# Patient Record
Sex: Female | Born: 1955 | Race: Black or African American | Hispanic: No | Smoking: Current every day smoker
Health system: Southern US, Community
[De-identification: ages and names within clinical notes are randomized; demographics above are authoritative.]

## PROBLEM LIST (undated history)

## (undated) DIAGNOSIS — I10 Essential (primary) hypertension: Secondary | ICD-10-CM

## (undated) DIAGNOSIS — E7800 Pure hypercholesterolemia, unspecified: Secondary | ICD-10-CM

## (undated) HISTORY — PX: BLADDER SURGERY: SHX569

## (undated) HISTORY — PX: CHOLECYSTECTOMY: SHX55

## (undated) HISTORY — PX: SHOULDER SURGERY: SHX246

## (undated) HISTORY — PX: MANDIBLE FRACTURE SURGERY: SHX706

---

## 2001-09-15 ENCOUNTER — Emergency Department (HOSPITAL_COMMUNITY): Admission: EM | Admit: 2001-09-15 | Discharge: 2001-09-15 | Payer: Self-pay | Admitting: Emergency Medicine

## 2001-09-15 ENCOUNTER — Emergency Department (HOSPITAL_COMMUNITY): Admission: EM | Admit: 2001-09-15 | Discharge: 2001-09-15 | Payer: Self-pay

## 2001-09-16 ENCOUNTER — Inpatient Hospital Stay (HOSPITAL_COMMUNITY): Admission: AD | Admit: 2001-09-16 | Discharge: 2001-09-20 | Payer: Self-pay | Admitting: Oral Surgery

## 2001-09-16 ENCOUNTER — Encounter: Payer: Self-pay | Admitting: Oral Surgery

## 2001-09-16 ENCOUNTER — Encounter (INDEPENDENT_AMBULATORY_CARE_PROVIDER_SITE_OTHER): Payer: Self-pay | Admitting: Specialist

## 2001-09-19 ENCOUNTER — Encounter: Payer: Self-pay | Admitting: Oral Surgery

## 2002-01-20 ENCOUNTER — Encounter: Admission: RE | Admit: 2002-01-20 | Discharge: 2002-01-20 | Payer: Self-pay | Admitting: Internal Medicine

## 2002-01-20 ENCOUNTER — Encounter: Payer: Self-pay | Admitting: Internal Medicine

## 2002-05-27 ENCOUNTER — Encounter: Payer: Self-pay | Admitting: Oral Surgery

## 2002-05-27 ENCOUNTER — Inpatient Hospital Stay (HOSPITAL_COMMUNITY): Admission: RE | Admit: 2002-05-27 | Discharge: 2002-05-31 | Payer: Self-pay | Admitting: Oral Surgery

## 2003-02-10 ENCOUNTER — Emergency Department (HOSPITAL_COMMUNITY): Admission: EM | Admit: 2003-02-10 | Discharge: 2003-02-10 | Payer: Self-pay | Admitting: Emergency Medicine

## 2003-02-26 ENCOUNTER — Encounter: Admission: RE | Admit: 2003-02-26 | Discharge: 2003-02-26 | Payer: Self-pay | Admitting: Family Medicine

## 2003-02-26 ENCOUNTER — Encounter: Payer: Self-pay | Admitting: Family Medicine

## 2003-04-15 ENCOUNTER — Emergency Department (HOSPITAL_COMMUNITY): Admission: EM | Admit: 2003-04-15 | Discharge: 2003-04-15 | Payer: Self-pay | Admitting: Emergency Medicine

## 2003-04-18 ENCOUNTER — Emergency Department (HOSPITAL_COMMUNITY): Admission: EM | Admit: 2003-04-18 | Discharge: 2003-04-18 | Payer: Self-pay | Admitting: Emergency Medicine

## 2004-06-10 ENCOUNTER — Ambulatory Visit: Payer: Self-pay | Admitting: Family Medicine

## 2004-06-10 ENCOUNTER — Ambulatory Visit: Payer: Self-pay | Admitting: *Deleted

## 2004-07-04 ENCOUNTER — Ambulatory Visit: Payer: Self-pay | Admitting: Family Medicine

## 2004-07-05 ENCOUNTER — Emergency Department (HOSPITAL_COMMUNITY): Admission: EM | Admit: 2004-07-05 | Discharge: 2004-07-05 | Payer: Self-pay | Admitting: Emergency Medicine

## 2004-07-06 ENCOUNTER — Emergency Department (HOSPITAL_COMMUNITY): Admission: EM | Admit: 2004-07-06 | Discharge: 2004-07-06 | Payer: Self-pay | Admitting: Emergency Medicine

## 2004-10-06 ENCOUNTER — Ambulatory Visit: Payer: Self-pay | Admitting: Family Medicine

## 2005-02-03 ENCOUNTER — Ambulatory Visit: Payer: Self-pay | Admitting: Family Medicine

## 2005-03-13 ENCOUNTER — Ambulatory Visit: Payer: Self-pay | Admitting: Family Medicine

## 2005-05-16 ENCOUNTER — Ambulatory Visit: Payer: Self-pay | Admitting: Family Medicine

## 2005-07-05 ENCOUNTER — Ambulatory Visit: Payer: Self-pay | Admitting: Family Medicine

## 2005-08-23 ENCOUNTER — Ambulatory Visit: Payer: Self-pay | Admitting: Family Medicine

## 2005-09-05 ENCOUNTER — Ambulatory Visit: Payer: Self-pay | Admitting: Family Medicine

## 2006-01-10 ENCOUNTER — Ambulatory Visit: Payer: Self-pay | Admitting: Family Medicine

## 2006-02-08 ENCOUNTER — Ambulatory Visit: Payer: Self-pay | Admitting: Family Medicine

## 2006-05-07 ENCOUNTER — Ambulatory Visit: Payer: Self-pay | Admitting: Family Medicine

## 2006-09-06 ENCOUNTER — Ambulatory Visit: Payer: Self-pay | Admitting: Family Medicine

## 2006-10-09 ENCOUNTER — Ambulatory Visit (HOSPITAL_COMMUNITY): Admission: RE | Admit: 2006-10-09 | Discharge: 2006-10-09 | Payer: Self-pay | Admitting: Obstetrics and Gynecology

## 2006-11-05 ENCOUNTER — Ambulatory Visit: Payer: Self-pay | Admitting: Family Medicine

## 2007-01-02 ENCOUNTER — Ambulatory Visit: Payer: Self-pay | Admitting: Family Medicine

## 2007-04-01 DIAGNOSIS — I10 Essential (primary) hypertension: Secondary | ICD-10-CM

## 2007-04-01 DIAGNOSIS — E785 Hyperlipidemia, unspecified: Secondary | ICD-10-CM | POA: Insufficient documentation

## 2007-04-13 ENCOUNTER — Emergency Department (HOSPITAL_COMMUNITY): Admission: EM | Admit: 2007-04-13 | Discharge: 2007-04-14 | Payer: Self-pay | Admitting: Emergency Medicine

## 2007-05-23 ENCOUNTER — Ambulatory Visit (HOSPITAL_COMMUNITY): Admission: RE | Admit: 2007-05-23 | Discharge: 2007-05-23 | Payer: Self-pay | Admitting: Dentistry

## 2007-07-10 ENCOUNTER — Ambulatory Visit: Payer: Self-pay | Admitting: Internal Medicine

## 2007-07-10 ENCOUNTER — Encounter (INDEPENDENT_AMBULATORY_CARE_PROVIDER_SITE_OTHER): Payer: Self-pay | Admitting: Family Medicine

## 2007-07-10 LAB — CONVERTED CEMR LAB
AST: 15 units/L (ref 0–37)
Albumin: 4.2 g/dL (ref 3.5–5.2)
Alkaline Phosphatase: 57 units/L (ref 39–117)
CO2: 25 meq/L (ref 19–32)
Cholesterol: 187 mg/dL (ref 0–200)
HDL: 41 mg/dL (ref >39)
Potassium: 3.7 meq/L (ref 3.5–5.3)
Sodium: 140 meq/L (ref 135–145)

## 2007-07-29 ENCOUNTER — Ambulatory Visit (HOSPITAL_COMMUNITY): Admission: RE | Admit: 2007-07-29 | Discharge: 2007-07-30 | Payer: Self-pay | Admitting: Surgery

## 2007-07-29 ENCOUNTER — Encounter (INDEPENDENT_AMBULATORY_CARE_PROVIDER_SITE_OTHER): Payer: Self-pay | Admitting: Surgery

## 2007-11-08 ENCOUNTER — Emergency Department (HOSPITAL_COMMUNITY): Admission: EM | Admit: 2007-11-08 | Discharge: 2007-11-08 | Payer: Self-pay | Admitting: Emergency Medicine

## 2007-11-10 ENCOUNTER — Emergency Department (HOSPITAL_COMMUNITY): Admission: EM | Admit: 2007-11-10 | Discharge: 2007-11-10 | Payer: Self-pay | Admitting: Emergency Medicine

## 2007-11-20 ENCOUNTER — Ambulatory Visit (HOSPITAL_COMMUNITY): Admission: RE | Admit: 2007-11-20 | Discharge: 2007-11-20 | Payer: Self-pay | Admitting: Family Medicine

## 2008-02-19 ENCOUNTER — Ambulatory Visit: Payer: Self-pay | Admitting: Internal Medicine

## 2008-02-20 ENCOUNTER — Emergency Department (HOSPITAL_COMMUNITY): Admission: EM | Admit: 2008-02-20 | Discharge: 2008-02-20 | Payer: Self-pay | Admitting: Emergency Medicine

## 2008-03-25 ENCOUNTER — Encounter (INDEPENDENT_AMBULATORY_CARE_PROVIDER_SITE_OTHER): Payer: Self-pay | Admitting: Family Medicine

## 2008-03-25 ENCOUNTER — Ambulatory Visit: Payer: Self-pay | Admitting: Internal Medicine

## 2008-03-25 LAB — CONVERTED CEMR LAB
ALT: 17 units/L (ref 0–35)
AST: 14 units/L (ref 0–37)
Albumin: 4.4 g/dL (ref 3.5–5.2)
Alkaline Phosphatase: 63 units/L (ref 39–117)
Glucose, Bld: 109 mg/dL — ABNORMAL HIGH (ref 70–99)
HDL: 50 mg/dL (ref >39)
Total Bilirubin: 0.3 mg/dL (ref 0.3–1.2)
Total CHOL/HDL Ratio: 3.7
Total Protein: 7.2 g/dL (ref 6.0–8.3)
Triglycerides: 204 mg/dL — ABNORMAL HIGH (ref <150)

## 2008-03-27 ENCOUNTER — Emergency Department (HOSPITAL_COMMUNITY): Admission: EM | Admit: 2008-03-27 | Discharge: 2008-03-27 | Payer: Self-pay | Admitting: Emergency Medicine

## 2008-06-17 ENCOUNTER — Emergency Department (HOSPITAL_COMMUNITY): Admission: EM | Admit: 2008-06-17 | Discharge: 2008-06-17 | Payer: Self-pay | Admitting: Emergency Medicine

## 2008-07-19 ENCOUNTER — Emergency Department (HOSPITAL_COMMUNITY): Admission: EM | Admit: 2008-07-19 | Discharge: 2008-07-19 | Payer: Self-pay | Admitting: Emergency Medicine

## 2008-08-17 ENCOUNTER — Ambulatory Visit: Payer: Self-pay | Admitting: Internal Medicine

## 2008-09-06 ENCOUNTER — Emergency Department (HOSPITAL_COMMUNITY): Admission: EM | Admit: 2008-09-06 | Discharge: 2008-09-06 | Payer: Self-pay | Admitting: Emergency Medicine

## 2008-10-03 ENCOUNTER — Emergency Department (HOSPITAL_COMMUNITY): Admission: EM | Admit: 2008-10-03 | Discharge: 2008-10-03 | Payer: Self-pay | Admitting: Family Medicine

## 2008-11-21 ENCOUNTER — Inpatient Hospital Stay (HOSPITAL_COMMUNITY): Admission: AD | Admit: 2008-11-21 | Discharge: 2008-11-21 | Payer: Self-pay | Admitting: Obstetrics & Gynecology

## 2008-12-08 ENCOUNTER — Ambulatory Visit (HOSPITAL_COMMUNITY): Admission: RE | Admit: 2008-12-08 | Discharge: 2008-12-08 | Payer: Self-pay | Admitting: Family Medicine

## 2009-06-19 ENCOUNTER — Emergency Department (HOSPITAL_COMMUNITY): Admission: EM | Admit: 2009-06-19 | Discharge: 2009-06-19 | Payer: Self-pay | Admitting: Emergency Medicine

## 2009-09-06 ENCOUNTER — Encounter: Admission: RE | Admit: 2009-09-06 | Discharge: 2009-09-06 | Payer: Self-pay | Admitting: Orthopedic Surgery

## 2010-06-10 ENCOUNTER — Emergency Department (HOSPITAL_COMMUNITY): Admission: EM | Admit: 2010-06-10 | Discharge: 2010-06-10 | Payer: Self-pay | Admitting: Emergency Medicine

## 2010-07-03 ENCOUNTER — Emergency Department (HOSPITAL_COMMUNITY): Admission: EM | Admit: 2010-07-03 | Discharge: 2010-07-03 | Payer: Self-pay | Admitting: Emergency Medicine

## 2010-08-11 ENCOUNTER — Emergency Department (HOSPITAL_COMMUNITY): Admission: EM | Admit: 2010-08-11 | Discharge: 2010-08-11 | Payer: Self-pay | Admitting: Emergency Medicine

## 2010-11-25 ENCOUNTER — Emergency Department (HOSPITAL_COMMUNITY)
Admission: EM | Admit: 2010-11-25 | Discharge: 2010-11-25 | Disposition: A | Discharge status: Home / Self Care | Payer: Medicare HMO | Attending: Emergency Medicine | Admitting: Emergency Medicine

## 2010-11-25 DIAGNOSIS — R220 Localized swelling, mass and lump, head: Secondary | ICD-10-CM | POA: Insufficient documentation

## 2010-11-25 DIAGNOSIS — Z8659 Personal history of other mental and behavioral disorders: Secondary | ICD-10-CM | POA: Insufficient documentation

## 2010-11-25 DIAGNOSIS — H9210 Otorrhea, unspecified ear: Secondary | ICD-10-CM | POA: Insufficient documentation

## 2010-11-25 DIAGNOSIS — I10 Essential (primary) hypertension: Secondary | ICD-10-CM | POA: Insufficient documentation

## 2010-11-25 DIAGNOSIS — H9209 Otalgia, unspecified ear: Secondary | ICD-10-CM | POA: Insufficient documentation

## 2010-11-25 DIAGNOSIS — Z79899 Other long term (current) drug therapy: Secondary | ICD-10-CM | POA: Insufficient documentation

## 2010-11-25 DIAGNOSIS — R221 Localized swelling, mass and lump, neck: Secondary | ICD-10-CM | POA: Insufficient documentation

## 2010-11-25 DIAGNOSIS — E789 Disorder of lipoprotein metabolism, unspecified: Secondary | ICD-10-CM | POA: Insufficient documentation

## 2010-11-25 DIAGNOSIS — H60399 Other infective otitis externa, unspecified ear: Secondary | ICD-10-CM | POA: Insufficient documentation

## 2010-11-25 DIAGNOSIS — Z9889 Other specified postprocedural states: Secondary | ICD-10-CM | POA: Insufficient documentation

## 2010-12-06 ENCOUNTER — Other Ambulatory Visit (HOSPITAL_COMMUNITY): Payer: Self-pay | Admitting: Internal Medicine

## 2010-12-06 DIAGNOSIS — Z1231 Encounter for screening mammogram for malignant neoplasm of breast: Secondary | ICD-10-CM

## 2010-12-13 LAB — DIFFERENTIAL
Eosinophils Absolute: 0.1 10*3/uL (ref 0.0–0.7)
Eosinophils Relative: 1 % (ref 0–5)
Lymphs Abs: 3.2 10*3/uL (ref 0.7–4.0)
Monocytes Absolute: 0.9 10*3/uL (ref 0.1–1.0)
Monocytes Relative: 8 % (ref 3–12)

## 2010-12-13 LAB — COMPREHENSIVE METABOLIC PANEL
ALT: 19 U/L (ref 0–35)
Alkaline Phosphatase: 84 U/L (ref 39–117)
BUN: 5 mg/dL — ABNORMAL LOW (ref 6–23)
GFR calc Af Amer: >60 mL/min (ref >60)
GFR calc non Af Amer: >60 mL/min (ref >60)
Glucose, Bld: 128 mg/dL — ABNORMAL HIGH (ref 70–99)
Potassium: 3.1 mEq/L — ABNORMAL LOW (ref 3.5–5.1)

## 2010-12-13 LAB — LIPASE, BLOOD: Lipase: 25 U/L (ref 11–59)

## 2010-12-13 LAB — CBC
MCHC: 33.9 g/dL (ref 30.0–36.0)
MCV: 78.6 fL (ref 78.0–100.0)
Platelets: 262 10*3/uL (ref 150–400)
RDW: 17.7 % — ABNORMAL HIGH (ref 11.5–15.5)
WBC: 11.2 10*3/uL — ABNORMAL HIGH (ref 4.0–10.5)

## 2011-01-06 ENCOUNTER — Ambulatory Visit (HOSPITAL_COMMUNITY)
Admission: RE | Admit: 2011-01-06 | Discharge: 2011-01-06 | Disposition: A | Discharge status: Home / Self Care | Payer: Medicare Other | Source: Ambulatory Visit | Attending: Internal Medicine | Admitting: Internal Medicine

## 2011-01-06 DIAGNOSIS — Z1231 Encounter for screening mammogram for malignant neoplasm of breast: Secondary | ICD-10-CM | POA: Insufficient documentation

## 2011-01-17 LAB — CBC
Hemoglobin: 10.6 g/dL — ABNORMAL LOW (ref 12.0–15.0)
RBC: 5.18 MIL/uL — ABNORMAL HIGH (ref 3.87–5.11)
RDW: 21.4 % — ABNORMAL HIGH (ref 11.5–15.5)
WBC: 9.3 10*3/uL (ref 4.0–10.5)

## 2011-01-17 LAB — URINALYSIS, ROUTINE W REFLEX MICROSCOPIC
Nitrite: NEGATIVE
Protein, ur: NEGATIVE mg/dL
Urobilinogen, UA: 0.2 mg/dL (ref 0.0–1.0)

## 2011-02-14 NOTE — Op Note (Signed)
NAMEMARGRETT, KALB           ACCOUNT NO.:  [id number]   MEDICAL RECORD NO.:  [id number]          PATIENT TYPE:  OIB   LOCATION:  5731                         FACILITY:  [facility]   PHYSICIAN:  Sandria Bales. Ezzard Standing, M.D.  DATE OF BIRTH:  Feb 16, 1956   DATE OF PROCEDURE:  07/29/2007  DATE OF DISCHARGE:                               OPERATIVE REPORT   PREOPERATIVE DIAGNOSIS:  Chronic cholecystitis, cholelithiasis.   POSTOPERATIVE DIAGNOSIS:  Chronic cholecystitis, cholelithiasis, intra-  abdominal adhesions secondary to prior abdominal surgery.   PROCEDURE:  Laparoscopic cholecystectomy with intraoperative  cholangiogram and lysis of internal abdominal adhesions (15 units).   SURGEON:  Sandria Bales. Ezzard Standing, M.D.   FIRST ASSISTANT:  Lennie Muckle, MD   ANESTHESIA:  General tracheal.   ESTIMATED BLOOD LOSS:  Minimal.   INDICATIONS FOR PROCEDURE:  Ms. Cullinan is a 55 year old black female  patient of Dr. Margarette Canada at [facility] who has presented to the [facility] Emergency Room over the summer with right upper quadrant pain.  An  ultrasound has shown multiple gallstones.  She did have a history of an  open partial gastrectomy for ulcer disease in 1978,and an appendectomy  through a lower abdominal incision.   I discussed with her the indications, potential complications of  gallbladder surgery.  Potential complications include but not limited to  bleeding, infection, bile duct injury and open surgery.  Because her  prior history of upper abdominal surgery, she was at increased risk for  open surgery and potential complications of gallbladder surgery.   OPERATIVE NOTE:  The patient taken to the operating room and underwent  general endotracheal anesthetic.  A time-out was held identifying the  patient and procedure.  She was given one gram of Ancef at initiation of  procedure.   An infraumbilical incision was made with sharp dissection and carried  down to the abdominal cavity. On  entering abdominal cavity patient was  noted to have adhesions in the right upper quadrant where I was trying  to get to the gallbladder.  I placed two 5 mm trocars below these  adhesions.  I tried to place them in a form in which I could also do the  gallbladder surgery.  I spent about 15 minutes taking down these  anterior abdominal wall adhesions.  However, when I got to the right  upper quadrant on the gallbladder and liver this area actually freed up  pretty well.  She also had adhesions on her midline and adhesions  towards the pelvis but I left these alone.   The right lobe of the liver that I could see was otherwise unremarkable.  The gallbladder itself though was stuck up, the duodenal was stuck up to  the gallbladder whether this is due to chronic disease or due to prior  abdominal surgery is hard to know.   The gallbladder was grasped, rotated cephalad.  Dissection carried out  on the cystic duct gallbladder junction.  I placed a clip on the  gallbladder side of the cystic duct.  I then shot intraoperative  cholangiogram.   The intraoperative cholangiogram was shot using cutoff  taut catheter  inserted through a 14 gauge Jelco to the receiver catheter and inserted  through the side of the cut cystic duct and secured with an Endo clip.   After the taut catheter was inserted.  I then shot half-strength Hypaque  solution.  I used about 8 to 9 mL actually high strength Renografin  solution.  This was looked at under fluoroscopy and showed flow down the  cystic duct, down the common bile duct into the duodenum out the hepatic  radicals was felt to be normal intraoperative cholangiogram.   The taut catheter was then removed, the cystic duct was then triply  endoclipped and divided.  I then found anterior and posterior branch of  cystic artery which I doubly endoclipped, divided.  It appeared that her  cystic artery dove back in the liver so this may have been some kind of   aberrant right hepatic artery but I thought I preserved this anatomy  during the surgery.   I then took the gallbladder both grossly and bluntly off the gallbladder  bed.  I completely divided the gallbladder.  I then revisualized the  gallbladder bed and the triangle of Calot.  I saw no bleeding or bile  leak.  I then delivered the gallbladder in the EndoCatch bag and  delivered it through the umbilicus.  The abdomen was then irrigated with  about 1.5 L saline.  The gallbladder bed again was reevaluated. I did  look at her pelvis which she had a uterus that had multiple fibroids.  These all appeared grossly benign.  I left this alone.  The 5 mm trocars  were then removed, the 10 mm trocar was a zero Vicryl suture was tied  there.  I then reinspect the abdominal wall which looked good.  The skin  at each site was closed with 5-0 Vicryl suture painted with tincture of  benzoin and steri-stripped.   The patient tolerated procedure well, was transported to recovery room  in good condition.  Sponge and needle counts correct end the case.      Sandria Bales. Ezzard Standing, M.D.  Electronically Signed     DHN/MEDQ  D:  07/29/2007  T:  07/30/2007  Job:  [id number]   cc:   Kelly Splinter, Kathie Rhodes [address]

## 2011-02-17 NOTE — Discharge Summary (Signed)
[place]. [facility]  Patient:    Sharon [name], Sharon [name] Visit Number: [id number] MRN: [id number]          Service Type: MED Location: [phone] Attending Physician:  Georgia Lopes Dictated by:   Georgia Lopes, D.M.D. Admit Date:  09/16/2001 Discharge Date: 09/20/2001                             Discharge Summary  ADMISSION DIAGNOSES:  Tumor, right mandible.  DISCHARGE DIAGNOSIS:  Tumor, right mandible.  HOSPITAL COURSE:  The patient was admitted on September 16, 2001, for removal of tumor of right mandible.  She was taken to the operating room and the tumor was removed in the previously dictated operative note.  There was some infection at the time of tumor removal.  The patient was admitted postoperatively and placed on intravenous pain medicine, intravenous antibiotics.  She had extraoral Jackson-Pratt drain which continued to drain mild to moderate amounts and the drain was removed on December 20.  She remained afebrile and stable postoperatively.  At the time of her discharge, she was able to tolerate p.o. fluids and soft diet and had mild postoperative edema.  DISCHARGE MEDICATIONS: 1. Percocet 10/325 60 tablets one q.4-6h. p.r.n. pain. 2. Augmentin 875 28 tablets one b.i.d. for two weeks. 3. Peridex one bottle swish and spit t.i.d. 4. Nifedinpine ER 30 mg 90 tablets one p.o. q.d. 5. Zyprexa 15 mg one p.o. b.i.d. 60 tablets two refills. 6. Zantac 150 mg 30 tablets one q.d. p.o. one refill.  ACTIVITY:  The patient was told to take it easy with no lifting or bending over.  She was told to remain on soft foods.  To add Ensure and Sustical to her diet.  She was instructed to use warm salt water rinses after each meal and to brush teeth after meals.  She was told to apply ice to her face for three days and then use moist heat.  She was given a follow-up appointment on Monday, December 23, for follow-up.  CONDITION ON DISCHARGE:  Much  improved.  Tumor was removed.  Mandible was stabilized with a large reconstructive bone plate as verified by postoperative x-ray. Dictated by:   Georgia Lopes, D.M.D. Attending Physician:  Georgia Lopes DD:  09/20/01 TD:  09/22/01 Job: [id number]

## 2011-02-17 NOTE — Discharge Summary (Signed)
NAME:  Sharon [name], Sharon [name]                     ACCOUNT NO.:  [id number]   MEDICAL RECORD NO.:  [id number]                   PATIENT TYPE:  INP   LOCATION:  5733                                 FACILITY:  [facility]   PHYSICIAN:  Georgia Lopes, M.D.               DATE OF BIRTH:  December 30, 1955   DATE OF ADMISSION:  05/27/2002  DATE OF DISCHARGE:                                 DISCHARGE SUMMARY   ADMITTING DIAGNOSIS:  Continuity defect right mandible.   DISCHARGE DIAGNOSIS:  Continuity defect right mandible, status post repair  with autogenous bone graft.   HOSPITAL COURSE:  The patient was admitted for surgery to reconstruct a  defect that occurred after removal of an aggressive bone tumor in December  2002.  She underwent surgery on May 27, 2002.  At that time the  reconstruction bone plate was removed and a cadaver mandible graft was  adapted and placed in the jaw and autogenous bone graft was harvested by Dr.  Marcene Corning concurrently.  The surgery went very well.  The patient's  jaws were wired shut after surgery.  She received intravenous antibiotics,  intravenous steroids, intravenous pain medicine via PCA pump Morphine.  She  had a stable intraoperative course.  A postoperative radiograph demonstrated  near anatomic alignment of the bone graft.  She was weaned off of  intravenous fluids and was able to increase her P.O. intake to an adequate  level.  She was ambulating well after her surgery and was discharged on  May 31, 2002.   CONDITION AT DISCHARGE:  Good.  Reconstruction of mandible satisfactory.   DISCHARGE MEDICATIONS:  Augmentin Suspension 10 day supply 500 mg q 6 hours  times 10 days, Medrol Dose Pack one pack, the patient was instructed to  crush the pills and take them as directed, and Demerol Syrup 50 mg per 5 cc  8 ounces, take one teaspoon q 4-6 hours p.r.n. pain.  She was instructed to  use Demerol for moderate to severe pain and to use liquid Tylenol for  mild  pain.   ACTIVITY:  No heavy lifting and generally taking it easy.   DIET:  Pureed diet as the patient's jaws were wired shut.   SPECIAL INSTRUCTIONS:  Ice to be applied to the right face on and off, 30  minutes on, 30 minutes off while the patient was awake.    FOLLOW UP:  Follow up visit scheduled for Wednesday, June 04, 2002,  patient to call [phone] for time of appointment.                                               Georgia Lopes, M.D.    SMJ/MEDQ  D:  05/31/2002  T:  06/02/2002  Job:  [id number]   cc:  Lubertha Basque Jerl Santos, M.D.  [address]  [place]  [place] [address]  Fax: [phone]

## 2011-02-17 NOTE — H&P (Signed)
. [facility]  Patient:    Sharon [name], Sharon [name] Visit Number: [id number] MRN: [id number]          Service Type: Attending:  Georgia Lopes, D.M.D. Dictated by:   Georgia Lopes, D.M.D. Adm. Date:  09/16/01                           History and Physical  CHIEF COMPLAINT:  Sharon [name] is a 55 year old black female who was seen in my office on September 04, 2001, with the chief complaint of swelling and pain of the right jaw.  She was referred by her general dentist, Dr. Deshawn Friar, for evaluation of possible tumor of the right mandible.  HISTORY OF PRESENT ILLNESS:  [place] notes that she has had some swelling of this area for the past three to six months.  A panoramic radiograph was taken in the office which demonstrated a multilocular radiolucency approximately 3.0 cm x 6.0 cm distal to tooth #30, extending up the ascending ramus of the right mandible.  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  An incisional biopsy was taken in the office on September 09, 2001, and the pathology result was consistent with odontogenic keratocyst.  This specimen was submitted to [facility].  The patient developed swelling and fever postoperatively after this biopsy done under local anesthesia, and presented to [facility] on September 15, 2001, complaining of inability to swallow, and swelling.  She was examined there and had some right pharyngeal and tonsillar wall edema, but had no exudate at that time, and minimal trismus.  She could open to 25.0 mm.  She was sent to the office the next day, after being placed on Augmentin 875 mg and Vicodin.  On September 16, 2001, on exam in the office she did have some exudate from the right pharyngeal and tonsillar wall areas.  This area was irrigated, and the patient was admitted for intravenous antibiotics and scheduled for a surgical excision of the tumor.  ALLERGIES:   No known drug allergies.  PAST MEDICAL HISTORY 1. Hypertension. 2. Paranoid schizophrenia. 3. History of arthritis. 4. Stomach ulcers. 5. Venereal disease.  CURRENT MEDICATIONS 1. Zyprexa 15 mg b.i.d. 2. Nifedipine ER 30 mg q.d. 3. Ranitidine 150 mg q.d.  PAST SURGICAL HISTORY:  Ulcer operation in 1978.  SOCIAL HISTORY:  One pack a day for six years of tobacco, and one can of beer per day.  PHYSICAL EXAMINATION  GENERAL:  A well-developed, well-nourished 55 year old black female, in no obvious distress.  VITAL SIGNS:  Blood pressure 160/90, pulse 112, respirations 16.  Height 5 feet 3 inches, weight 200 pounds.  SKIN:  Without rashes or lesions.  HEENT:  Head normocephalic, atraumatic.  Eyes:  Pupils equal, round, reactive to light and accommodation.  Ears:  Tympanic membranes intact.  Nose:  Septum midline.  Oral:  Swelling in the right mandibular ascending ramus, with swelling of right pharyngeal and tonsillar walls, slight uvular shift to the left.  The patient opens to approximately 25 mm.  Dentition appears healthy. Lateral swelling of the right mandible, nontender.  NECK:  Supple, no jugular venous distention.  HEART:  A regular rate and rhythm without murmur.  LUNGS:  Clear.  ABDOMEN:  Soft, positive bowel sounds, nontender.  BREASTS/GU/RECTAL:  Deferred.  EXTREMITIES:  Without clubbing, cyanosis, or edema.  NEUROLOGIC:  Intact.  Cranial nerves intact.  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  PLAN:  Admission with intravenous antibiotics and resection of the lesion on September 18, 2001. Dictated by:   Georgia Lopes, D.M.D. Attending:  Georgia Lopes, D.M.D. DD:  09/17/01 TD:  09/17/01 Job: [id number]

## 2011-02-17 NOTE — H&P (Signed)
NAME:  Sharon [name], Sharon [name]                     ACCOUNT NO.:  [id number]   MEDICAL RECORD NO.:  [id number]                   PATIENT TYPE:  INP   LOCATION:  2550                                 FACILITY:  [facility]   PHYSICIAN:  Georgia Lopes, M.D.               DATE OF BIRTH:  Aug 26, 1956   DATE OF ADMISSION:  05/27/2002  DATE OF DISCHARGE:                                HISTORY & PHYSICAL   HISTORY OF PRESENT ILLNESS:  The patient is a 55 year old black female who  presents now for reconstruction of the right mandible, status post  mandibular resection on September 16, 2001.  She complains of inability to  eat and chew properly and paresis of the right lower lip.   ALLERGIES:  She has no known drug allergies.   PAST MEDICAL HISTORY:  Hypertension, paranoid schizophrenia, history of  arthritis, stomach ulcers, venereal disease.   CURRENT MEDICATIONS:  1. Zyprexa 50 mg b.i.d.  2. Nifedipine ER 30 mg q.d.  3. Amantadine 150 mg q.d.   HISTORY OF PRESENT ILLNESS:  The patient has undergone jaw resection,  September 16, 2001, for removal of aggressive odontogenic keratocyst that was  also secondarily infected, a large reconstruction, and bone plate was placed  at the time of her initial surgery.  The condyle was spared and the right  mandible was spared to the second premolar.  The nerve was sacrificed during  the surgery.   PAST SURGICAL HISTORY:  Ulcer operation in 1978; jaw resection, September 16, 2001.   SOCIAL HISTORY:  Smokes one pack a day of tobacco, one can of beer per day.   PHYSICAL EXAMINATION:  GENERAL:  Moderately obese, 55 year old black female  in no acute distress.  VITAL SIGNS:  Heart rate 88, respirations 18, BP 153/97, 98.9.  Weight is  210.  SKIN:  Without rashes or lesions.  HEENT:  Head normocephalic, atraumatic.  Pupils are equal, round and  reactive to light and accommodation.  Ears:  Tympanic membranes intact.  Nose:  Septum midline.  Oropharynx.   Clear.  Missing teeth of the right  mandible secondary to jaw resection.  NECK:  Supple.  No JVD.  HEART:  Regular rate and rhythm.  No murmurs.  LUNGS:  Clear.  ABDOMEN:  Soft.  Positive bowel sounds.  Nontender.  BREASTS:  Deferred.  GU:  Deferred.  RECTAL:  Deferred.  EXTREMITIES:  Without cyanosis, clubbing or edema.  NEUROLOGIC:  Neurologically intact with exception of the mental branch of  the inferior alveolar nerve; there is numbness over the right lower lip.    IMPRESSION:  Status post resection of mandible.   PLAN:  Reconstruct with cadaver mandible and iliac crest autogenous bone.  Georgia Lopes, M.D.    SMJ/MEDQ  D:  05/27/2002  T:  05/28/2002  Job:  [id number]

## 2011-02-17 NOTE — Op Note (Signed)
[place]. [facility]  Patient:    EMMI, WERTHEIM Visit Number: [id number] MRN: [id number]          Service Type: MED Location: [phone] Attending Physician:  Georgia Lopes Dictated by:   Georgia Lopes, D.M.D. Proc. Date: 09/18/01 Admit Date:  09/16/2001                             Operative Report  PREOPERATIVE DIAGNOSIS:  Tumor of right mandible, odontogenic keratocyst.  POSTOPERATIVE DIAGNOSIS:  Tumor of right mandible, odontogenic keratocyst.  PROCEDURE:  Resection of right mandible, preserving the right mandibular condyle, and nerve repair of right lingual nerve.  SURGEON:  Georgia Lopes, D.M.D.  ANESTHESIA:  General nasal.  DESCRIPTION OF PROCEDURE:  The patient was taken to the operating room and placed on the table in the supine position.  General anesthesia was administered intravenously and a nasal endotracheal tube was placed and secured.  The eyes were lubricated and protected.  A Foley catheter was placed and the table was turned.  The patient was prepped and draped.  A throat pack was placed.  Then 2% lidocaine, 1:100,000 epinephrine was infiltrated as an inferior alveolar block and an infiltration on the right mandible.  A total of six carpules was used.  Palpation was performed over the spontaneously draining abscess area of the right tonsillar area of a former biopsy of the right mandible, and this was also irrigated and suctioned.  There was some serosanguineous exudate, but no frank pus.  Then attention was turned to the right extraoral area.  The marking pen was used to demarcate the area of the inferior border and the ascending ramus of the mandible, the area of the tumor, and the first molar on the right side. Then, an incision was demarcated at 1 cm below the inferior border of the mandible, and this was made approximately 6 cm long in length.  Then lidocaine 2%, 1:100,000 epinephrine was infiltrated along the  incision line.  A total of four carpules was utilized.  After allowing time for hemostasis and vasoconstriction, a #15 blade was used to make a full thickness incision through skin and subcutaneous tissue.  Dissection was carried down to the platysmal layer.  A nerve stimulator was used to test for the facial nerve, and the marginal mandibular _________ nerve was then identified in the dissection.  The patient was not paralyzed for the surgery.  Dissection was carried down through the platysma and then down through the deeper structures. The facial artery and vein were ligated with 2-0 silk sutures.  The inferior border of the mandible was identified and cut with sharp dissection.  Then the mandible was stripped buccally anterior up towards the mental foramen and beyond towards the midline and distally up to the area of the condyle and the coronoid notch.  There was no obvious perforation of the odontogenic tumor through the buccal plate, but upon lingual dissection, there was some evidence of perforation, and the dissection was stopped at this point, and attention was then turned intraorally.  A #15 blade was then used to make a full thickness incision, approximately a 0.5 cm from the neck of tooth #30 and the free gingival margin.  This was carried down to the bone circumferentially around this tooth on the lingual end, buccal side, and then this dissection was carried lingually over the ascending ramus and around the biopsy site, and  then leaving approximately 0.5 cm margin around the area of the tumor.  Periosteal dissection was used with the periosteal elevator to connect to the intraoral incision.  After this dissection was completed, attention was then turned back to the extraoral dissection.  A large OsteoMed 2.4 mm bone plate was then adapted to the external mandible and secured there with 2.4 screws.  Then the plate was removed and using the reciprocating saw, a vertical cut  was made distal to tooth #30, and then a cut was made in the area of the sigmoid notch similar to a vertical ramus osteotomy, sparing the condyle.  Then the mandible was mobilized with a Kocher bone clamp and dissection was further carried out on the lingual, attempting to dissect up the tissues towards the periosteum, taking care to preserve a margin of tissue around the tumor.  Then, the mandible was freed and removed from the mouth.  It was noted at this time that the lingual nerve was inadvertently pulled from its junction at the tongue, and a 7-0 Vicryl was used to reposition the nerve.  This was done using unmagnified visualization. The nerve ends were both cut in a straight line with scissors and sutured with four 7-0 Vicryl sutures in a primary nerve repair.  Then bleeding vessels left behind were cauterized and ligated with 3-0 silk.  The bone plate was reapproximated and rescrewed with the previously placed bone screws.  Then the loose tissue was closed with 3-0 Vicryl, closing from extraorally, the mucosal layer first, and then closing dead space.  A Jackson-Pratt 7 mm drain was placed, and then the tissues were closed in layers until the subcutaneous layers were closed with 3-0 chromic and the skin was closed with 5-0 nylon.  During placement of the bone plate, in order to drill screw holes, percutaneous incisions were made at the preauricular area with a 15 blade using blunt dissection, and the percutaneous trocar and cannula.  These incisions were then closed at the end of the case with one 5-0 nylon.  Then the Jackson-Pratt drain was secured to the skin with a 3-0 silk, and then the oral cavity was inspected.  There was one area that had not been completely closed with 3-0 Vicryl and this was closed after irrigating with bug juice with 3-0 chromic.  Then the oral cavity was totally suctioned.  The throat pack was removed.  The occlusion was assessed and found to be stable,  and good opening and closing.  Then Bacitracin was placed on the wound.  Fluffs and a 4 inch Ace bandage were placed for a pressure dressing.   The patient was awakened and taken to the recovery room, breathing spontaneously in good condition.  Estimated blood loss 300 cc.  Complications none.  Specimen was right mandible resected bone and lymph node from right mandible submandibular area. Dictated by:   Georgia Lopes, D.M.D. Attending Physician:  Georgia Lopes DD:  09/18/01 TD:  09/19/01 Job: [id number]

## 2011-02-17 NOTE — Op Note (Signed)
NAMEMarland Kitchen  [place], [place]                     ACCOUNT NO.:  [id number]   MEDICAL RECORD NO.:  [id number]                   PATIENT TYPE:  INP   LOCATION:  2899                                 FACILITY:  [facility]   PHYSICIAN:  Lubertha Basque. Jerl Santos, M.D.             DATE OF BIRTH:  [birth year]   DATE OF PROCEDURE:  05/27/2002  DATE OF DISCHARGE:                                 OPERATIVE REPORT   PREOPERATIVE DIAGNOSES:  Right mandibular continuity defect.   POSTOPERATIVE DIAGNOSES:  Right mandibular continuity defect.   OPERATION PERFORMED:  Left iliac crest bone graft harvest.   SURGEON:  Lubertha Basque. Jerl Santos, M.D.   ASSISTANT:  Prince Rome, P.A.   ANESTHESIA:  General.   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  She has been left with a  continuity defect.  She is to undergo allograft reconstruction of the  mandible and orthopedics was consulted for iliac crest bone graft harvest.  The procedure was discussed with the patient and informed operative consent  was obtained after discussion of possible complications of reaction to  anesthesia, fracture,  and pain about the iliac crest bone graft harvest  site.   DESCRIPTION OF PROCEDURE:  The patient was taken to the operating suite  where general anesthetic was applied without difficulty.  The patient was  positioned supine and prepped and draped in the normal sterile fashion.  After administration of preop intravenous antibiotics, the left iliac crest  was approached through a small incision.  Dissection was carried down  through an abundance of adipose tissue to the fascia over the anterior  aspect of the iliac crest.  Care was taken not to go medial to the ASIS.  Osteotomes were used to open a window on the superior aspect of the crest.  Curets were then used to harvest bone from between the two walls of the  ileum.  Several ounces of bone  were harvested with the curets.  The wound  was then irrigated by placement of half a sheet of Gelfoam into the iliac  crest area.  0 Vicryl was used to reapproximate the fascia over the bone  graft harvest site followed by 2-0 undyed  Vicryl suture in the adipose and  subcutaneous tissues.  Skin was then closed followed by injection of  Marcaine about the incision site and application of Adaptic and dry gauze  dressing with tape.  The oral surgery procedure was proceeding concomitantly  and continued after closure of the hip wound.  Estimated blood loss and  intraoperative fluids can be obtained from anesthesia records and from the  oral surgery notes.   DISPOSITION:  The patient was to continue with her oral surgery procedure.  Lubertha Basque Jerl Santos, M.D.   PGD/MEDQ  D:  05/27/2002  T:  05/29/2002  Job:  [id number]

## 2011-02-17 NOTE — Op Note (Signed)
NAME:  Sharon [name], Sharon [name]                     ACCOUNT NO.:  [id number]   MEDICAL RECORD NO.:  [id number]                   PATIENT TYPE:  INP   LOCATION:  5733                                 FACILITY:  [facility]   PHYSICIAN:  Georgia Lopes, M.D.               DATE OF BIRTH:  1956/09/09   DATE OF PROCEDURE:  05/27/2002  DATE OF DISCHARGE:                                 OPERATIVE REPORT   PREOPERATIVE DIAGNOSIS:  Right mandibular continuity defect.   PREOPERATIVE DIAGNOSIS:  Right mandibular continuity defect.   PROCEDURE:  Freeze dried hemimandible to restore continuity to right jaw,  iliac crest marrow to right mandible and intermaxillary fixation.   SURGEON:  Georgia Lopes, M.D.   ANESTHESIA:  General nasal.   DESCRIPTION OF PROCEDURE:  The patient was taken to the operating room and  placed on the table in the supine position.  General anesthesia was  administered and a nasal endotracheal tube was placed.  The eyes were  lubricated and protected and a nasogastric tube was placed. A Foley was also  placed.  Dr. Hurshel Keys began micronic EPA and proceeded to sterilely  drape the left iliac crest for graft harvest during that time.  The arch  bars were placed in the oral cavity.  This was done non-sterile. A throat  pack was placed and 2% lidocaine with 1:100,000 epinephrine was infiltrated  into a right and left inferior alveolar block and infiltration around the  maxilla, a total of 4 carpules were used. Then the [facility] arch bars were  adapted to the upper arch and ligated to the teeth with 24 gauge wires for  the molars, premolars and canines and 26 gauge wires for the anterior teeth.  Another arch bar was ligated to the lower jaw in the same fashion. Then the  throat pack was removed and elastic traction was used to place the teeth  into intermaxillary fixation.  At the termination of the hip graft, the  drapes were removed, a dressing had been applied to the hip and  the neck and  head were prepped and draped.  A 10/10 unit was used to drape off the mouth.  Then a sterile marking pen was used to demarcate the elliptical incision to  excise the previous scar from prior mandibular resection surgery.  Then 2%  lidocaine 1:100,000 with epinephrine was infiltrated for a total of 10 cc.  Then a #15 blade was used to make a long elliptical incision approximately  10 cm long and the previous scar was removed. The skin margins were then  undermined and dissection was carried down through the tissue layers taking  care to adjust for the facial nerve with a nerve stimulator.  The platysma  was not identified.  The dissection was carried down to the mandibular bone  plate.  Bleeding vessels were cauterized with electrocautery and ligated  with 2-0 silk ties.  Then the  dissection was over the existing distal  portion of the mandible on the right side and this was freed up to expose  the bone screws and dissection was also carried proximally along the area of  the condyle.  Then using a universal screwdriver all screws were removed and  the plate was removed.  The bony margins were then freshened and a cadaver  mandible was obtained and was trimmed to fit in this defect which was  approximately 7 cm long.  Bur holes were made in the lingual plate of the  freeze dried mandible and the buccal plate was removed. The condyle and  coronoid process were also removed.  After the mandibular graft was properly  trimmed, it was fixed to the distal segment using a four hole KLS plate, 2.3  mm.  Holes were drilled in the bone with a drill under irrigation and 5 and  7 mm screws were placed at the condyle. Another four hole plate was adapted  and screw holes were drilled and screws were placed.  Then the previously  harvested iliac graft was mixed with freeze dried bone to provide adequate  volume of grafting material and then this was placed in the hemimandible and  was packed  up to the condyle process and to the remaining stump of the  distal process of the mandible.  Then the area was closed with  3-0 Vicryl, 4-0 chromic and a running subcuticular 5-0 Monocryl. Bacitracin  was applied and 4 x 4s, Kerlix and an ace bandage were applied.  The patient  was awakened and taken to the recovery room in good condition. Estimated  blood loss was 150 cc.  Complications were none.  Specimens were none.                                                Georgia Lopes, M.D.    SMJ/MEDQ  D:  05/27/2002  T:  05/29/2002  Job:  [id number]

## 2011-07-03 LAB — URINALYSIS, ROUTINE W REFLEX MICROSCOPIC
Glucose, UA: NEGATIVE
Nitrite: NEGATIVE
Urobilinogen, UA: 0.2

## 2011-07-12 LAB — DIFFERENTIAL
Basophils Absolute: 0
Basophils Relative: 0
Eosinophils Absolute: 0.1
Lymphocytes Relative: 39
Lymphs Abs: 3.9 — ABNORMAL HIGH
Monocytes Absolute: 0.7
Monocytes Relative: 7

## 2011-07-12 LAB — COMPREHENSIVE METABOLIC PANEL
AST: 23
Albumin: 3.8
Alkaline Phosphatase: 63
BUN: 6
Calcium: 9.5
Chloride: 101
GFR calc Af Amer: >60
GFR calc non Af Amer: >60
Total Protein: 7

## 2011-07-12 LAB — CBC
MCHC: 32.9
RBC: 4.66
RDW: 15 — ABNORMAL HIGH
WBC: 10.1

## 2011-07-18 LAB — DIFFERENTIAL
Basophils Absolute: 0.1
Lymphocytes Relative: 17
Monocytes Absolute: 1 — ABNORMAL HIGH
Neutrophils Relative %: 75

## 2011-07-18 LAB — COMPREHENSIVE METABOLIC PANEL
AST: 27
Albumin: 3.5
Alkaline Phosphatase: 67
BUN: 12
Chloride: 101
Creatinine, Ser: 0.9
Sodium: 137
Total Bilirubin: 0.5
Total Protein: 7.3

## 2011-07-18 LAB — CBC
HCT: 40.1
Hemoglobin: 13.2
WBC: 15.8 — ABNORMAL HIGH

## 2011-07-18 LAB — LIPASE, BLOOD: Lipase: 16

## 2011-12-04 ENCOUNTER — Other Ambulatory Visit (HOSPITAL_COMMUNITY): Payer: Self-pay | Admitting: Family Medicine

## 2011-12-04 DIAGNOSIS — Z1231 Encounter for screening mammogram for malignant neoplasm of breast: Secondary | ICD-10-CM

## 2012-01-08 ENCOUNTER — Ambulatory Visit (HOSPITAL_COMMUNITY)
Admission: RE | Admit: 2012-01-08 | Discharge: 2012-01-08 | Disposition: A | Discharge status: Home / Self Care | Payer: Medicare HMO | Source: Ambulatory Visit | Attending: Family Medicine | Admitting: Family Medicine

## 2012-01-08 DIAGNOSIS — Z1231 Encounter for screening mammogram for malignant neoplasm of breast: Secondary | ICD-10-CM | POA: Insufficient documentation

## 2012-10-19 ENCOUNTER — Emergency Department (INDEPENDENT_AMBULATORY_CARE_PROVIDER_SITE_OTHER)
Admission: EM | Admit: 2012-10-19 | Discharge: 2012-10-19 | Disposition: A | Discharge status: Home / Self Care | Payer: Medicare Other | Source: Home / Self Care | Attending: Family Medicine | Admitting: Family Medicine

## 2012-10-19 ENCOUNTER — Encounter (HOSPITAL_COMMUNITY): Payer: Self-pay | Admitting: Emergency Medicine

## 2012-10-19 DIAGNOSIS — H00019 Hordeolum externum unspecified eye, unspecified eyelid: Secondary | ICD-10-CM

## 2012-10-19 HISTORY — DX: Essential (primary) hypertension: I10

## 2012-10-19 MED ORDER — MOXIFLOXACIN HCL 0.5 % OP SOLN: 1.0000 [drp] | Freq: Three times a day (TID) | OPHTHALMIC | Status: DC

## 2012-10-19 MED ORDER — CEPHALEXIN 500 MG PO CAPS: 500.0000 mg | ORAL_CAPSULE | Freq: Four times a day (QID) | ORAL | Status: DC

## 2012-10-19 NOTE — ED Provider Notes (Signed)
History     CSN: [id number]  Arrival date & time 10/19/12  1133   First MD Initiated Contact with Patient 10/19/12 1138      Chief Complaint  Patient presents with  . Stye    left lower eye lid swollen and red    (Consider location/radiation/quality/duration/timing/severity/associated sxs/prior treatment) Patient is a 57 y.o. female presenting with eye problem. The history is provided by the patient.  Eye Problem  This is a new problem. The current episode started more than 2 days ago. The problem occurs constantly. The problem has been gradually worsening. The left eye is affected.There was no injury mechanism. The pain is mild. There is no history of trauma to the eye. There is no known exposure to pink eye. She does not wear contacts. Pertinent negatives include no numbness, no blurred vision, no decreased vision, no discharge, no photophobia and no eye redness. Associated symptoms comments: H/o similar problems as child, none in many yrs.. She has tried water and eye drops for the symptoms.    Past Medical History  Diagnosis Date  . Hypertension     Past Surgical History  Procedure Date  . Cholecystectomy   . Bladder surgery   . Shoulder surgery   . Mandible fracture surgery     History reviewed. No pertinent family history.  History  Substance Use Topics  . Smoking status: Never Smoker   . Smokeless tobacco: Not on file  . Alcohol Use: No    OB History    Grav Para Term Preterm Abortions TAB SAB Ect Mult Living                  Review of Systems  Constitutional: Negative.   HENT: Negative.   Eyes: Positive for pain. Negative for blurred vision, photophobia, discharge, redness and itching.  Neurological: Negative for numbness.    Allergies  Review of patient's allergies indicates no known allergies.  Home Medications   Current Outpatient Rx  Name  Route  Sig  Dispense  Refill  . HYDRALAZINE-HCTZ PO   Oral   Take by mouth.         Marland Kitchen NIFEDIPINE  ER OSMOTIC 90 MG PO TB24   Oral   Take 90 mg by mouth daily.         Marland Kitchen OLANZAPINE 20 MG PO TABS   Oral   Take 20 mg by mouth at bedtime.         Marland Kitchen PRAVASTATIN SODIUM PO   Oral   Take by mouth.         . CEPHALEXIN 500 MG PO CAPS   Oral   Take 1 capsule (500 mg total) by mouth 4 (four) times daily. Take all of medicine and drink lots of fluids   20 capsule   0   . MOXIFLOXACIN HCL 0.5 % OP SOLN   Left Eye   Place 1 drop into the left eye 3 (three) times daily. After warm cloth soak to eyelid   3 mL   0     BP 168/98  Pulse 105  Temp 99 F (37.2 C) (Oral)  Resp 22  SpO2 96%  Physical Exam  Nursing note and vitals reviewed. Constitutional: She is oriented to person, place, and time. She appears well-developed and well-nourished.  HENT:  Head: Normocephalic.  Right Ear: External ear normal.  Left Ear: External ear normal.  Mouth/Throat: Oropharynx is clear and moist.  Eyes: Conjunctivae normal and EOM are normal. Pupils are  equal, round, and reactive to light.    Neck: Normal range of motion. Neck supple.  Neurological: She is alert and oriented to person, place, and time.  Skin: Skin is warm and dry.    ED Course  Procedures (including critical care time)  Labs Reviewed - No data to display No results found.   1. Stye       MDM          Linna Hoff, MD 10/19/12 1344

## 2012-10-19 NOTE — ED Notes (Signed)
Waiting discharge papers 

## 2012-10-19 NOTE — ED Notes (Addendum)
Pt c/o swelling of left lower eye lid. Eye is red and swollen with some soreness. Symptoms started on Tuesday morning. Pt states that she has used warm compresses and eye ointment with no relief of symptoms.

## 2012-12-07 ENCOUNTER — Encounter (HOSPITAL_COMMUNITY): Payer: Self-pay | Admitting: Emergency Medicine

## 2012-12-07 ENCOUNTER — Emergency Department (INDEPENDENT_AMBULATORY_CARE_PROVIDER_SITE_OTHER)
Admission: EM | Admit: 2012-12-07 | Discharge: 2012-12-07 | Disposition: A | Discharge status: Home / Self Care | Payer: Medicare Other | Source: Home / Self Care

## 2012-12-07 DIAGNOSIS — H00019 Hordeolum externum unspecified eye, unspecified eyelid: Secondary | ICD-10-CM

## 2012-12-07 DIAGNOSIS — H00016 Hordeolum externum left eye, unspecified eyelid: Secondary | ICD-10-CM

## 2012-12-07 HISTORY — DX: Pure hypercholesterolemia, unspecified: E78.00

## 2012-12-07 MED ORDER — MOXIFLOXACIN HCL 0.5 % OP SOLN: 1.0000 [drp] | Freq: Three times a day (TID) | OPHTHALMIC | Status: DC

## 2012-12-07 MED ORDER — CEPHALEXIN 500 MG PO CAPS: 500.0000 mg | ORAL_CAPSULE | Freq: Four times a day (QID) | ORAL | Status: DC

## 2012-12-07 NOTE — ED Notes (Signed)
Stye on left eye, swollen left eye lid.  Onset Thursday am

## 2012-12-07 NOTE — ED Provider Notes (Signed)
History     CSN: [id number]  Arrival date & time 12/07/12  1112   First MD Initiated Contact with Patient 12/07/12 1207      Chief Complaint  Patient presents with  . Stye    HPI: Patient is a 57 y.o. female presenting with eye problem. The history is provided by the patient.  Eye Problem Location:  L eye Quality:  Stinging and aching Onset quality:  Sudden Timing:  Constant Progression:  Worsening Chronicity:  Recurrent Relieved by:  Nothing Worsened by:  Nothing tried Ineffective treatments:  None tried Associated symptoms: inflammation, redness and swelling   Associated symptoms: no blurred vision, no crusting, no decreased vision, no discharge and no itching   Pt reports onset of a stye to the upper lid of her (L) eye Thursday morning. Since onset has grown in size and has become much more TTP. No pain in her eye unless she blinks. Denies visual disturbances or trauma to the eye. Denies fever or recent URI sx's. Admits to h/o getting these rater frequently in the past. States she was here in January and was given a PO abx and eye drops that really helped. Would like refills of these meds.   Past Medical History  Diagnosis Date  . Hypertension   . High cholesterol     Past Surgical History  Procedure Laterality Date  . Cholecystectomy    . Bladder surgery    . Shoulder surgery    . Mandible fracture surgery      No family history on file.  History  Substance Use Topics  . Smoking status: Never Smoker   . Smokeless tobacco: Not on file  . Alcohol Use: No    OB History   Grav Para Term Preterm Abortions TAB SAB Ect Mult Living                  Review of Systems  Eyes: Positive for redness. Negative for blurred vision, discharge and itching.  All other systems reviewed and are negative.    Allergies  Review of patient's allergies indicates no known allergies.  Home Medications   Current Outpatient Rx  Name  Route  Sig  Dispense  Refill  .  hydrochlorothiazide (HYDRODIURIL) 25 MG tablet   Oral   Take 25 mg by mouth daily.         . cephALEXin (KEFLEX) 500 MG capsule   Oral   Take 1 capsule (500 mg total) by mouth 4 (four) times daily. Take all of medicine and drink lots of fluids   20 capsule   0   . HYDRALAZINE-HCTZ PO   Oral   Take by mouth.         . moxifloxacin (VIGAMOX) 0.5 % ophthalmic solution   Left Eye   Place 1 drop into the left eye 3 (three) times daily. After warm cloth soak to eyelid   3 mL   0   . NIFEdipine (PROCARDIA XL/ADALAT-CC) 90 MG 24 hr tablet   Oral   Take 90 mg by mouth daily.         Marland Kitchen OLANZapine (ZYPREXA) 20 MG tablet   Oral   Take 20 mg by mouth at bedtime.         Marland Kitchen PRAVASTATIN SODIUM PO   Oral   Take by mouth.           BP 129/68  Pulse 103  Temp(Src) 98.5 F (36.9 C) (Oral)  Resp 20  SpO2 94%  Physical Exam  Nursing note and vitals reviewed. Constitutional: She appears well-developed and well-nourished.  HENT:  Head: Normocephalic and atraumatic.  Eyes: Conjunctivae are normal. Right eye exhibits no chemosis, no discharge, no exudate and no hordeolum. No foreign body present in the right eye. Left eye exhibits hordeolum. Left eye exhibits no chemosis, no discharge and no exudate. Right conjunctiva is not injected. Right conjunctiva has no hemorrhage. Left conjunctiva is not injected. Left conjunctiva has no hemorrhage. No scleral icterus.    Neck: Neck supple.  Cardiovascular: Normal rate.   Pulmonary/Chest: Effort normal.  Musculoskeletal: Normal range of motion.  Neurological: She is alert.  Skin: Skin is warm and dry.  Psychiatric: She has a normal mood and affect.    ED Course  Procedures (including critical care time)  Labs Reviewed - No data to display No results found.   No diagnosis found.    MDM  Stye to upper eyelid of (L) eye. Sclera and conjunctiva otherwise normal. Will treat w/ Cephalexin and Vigamox eye drops. Will provide  opthalmology referral for f/u if needed.         Leanne Chang, NP 12/07/12 [phone]

## 2012-12-08 NOTE — ED Provider Notes (Signed)
  I have directly reviewed the clinical findings, lab, imaging studies and management of this patient in detail. I  agree with the documentation,  as recorded by the Physician extender.  SINGH,PRASHANT K M.D on 12/08/2012 at 12:56 PM  [facility] Office  [phone] 

## 2012-12-10 ENCOUNTER — Other Ambulatory Visit (HOSPITAL_COMMUNITY): Payer: Self-pay | Admitting: Family Medicine

## 2012-12-10 DIAGNOSIS — Z1231 Encounter for screening mammogram for malignant neoplasm of breast: Secondary | ICD-10-CM

## 2013-01-08 ENCOUNTER — Ambulatory Visit (HOSPITAL_COMMUNITY)
Admission: RE | Admit: 2013-01-08 | Discharge: 2013-01-08 | Disposition: A | Discharge status: Home / Self Care | Payer: Medicare HMO | Source: Ambulatory Visit | Attending: Family Medicine | Admitting: Family Medicine

## 2013-01-08 DIAGNOSIS — Z1231 Encounter for screening mammogram for malignant neoplasm of breast: Secondary | ICD-10-CM | POA: Insufficient documentation

## 2013-01-13 ENCOUNTER — Encounter (HOSPITAL_COMMUNITY): Payer: Self-pay | Admitting: Emergency Medicine

## 2013-01-13 ENCOUNTER — Emergency Department (INDEPENDENT_AMBULATORY_CARE_PROVIDER_SITE_OTHER): Payer: Medicare HMO

## 2013-01-13 ENCOUNTER — Emergency Department (INDEPENDENT_AMBULATORY_CARE_PROVIDER_SITE_OTHER)
Admission: EM | Admit: 2013-01-13 | Discharge: 2013-01-13 | Disposition: A | Discharge status: Home / Self Care | Payer: Medicare HMO | Source: Home / Self Care | Attending: Emergency Medicine | Admitting: Emergency Medicine

## 2013-01-13 DIAGNOSIS — J309 Allergic rhinitis, unspecified: Secondary | ICD-10-CM

## 2013-01-13 DIAGNOSIS — J40 Bronchitis, not specified as acute or chronic: Secondary | ICD-10-CM

## 2013-01-13 DIAGNOSIS — J302 Other seasonal allergic rhinitis: Secondary | ICD-10-CM

## 2013-01-13 MED ORDER — FEXOFENADINE-PSEUDOEPHED ER 60-120 MG PO TB12: 1.0000 | ORAL_TABLET | Freq: Two times a day (BID) | ORAL | Status: AC

## 2013-01-13 MED ORDER — FLUTICASONE PROPIONATE 50 MCG/ACT NA SUSP: 2.0000 | Freq: Every day | NASAL | Status: DC

## 2013-01-13 MED ORDER — AMOXICILLIN 500 MG PO CAPS: 500.0000 mg | ORAL_CAPSULE | Freq: Three times a day (TID) | ORAL | Status: AC

## 2013-01-13 NOTE — ED Notes (Signed)
Pt c/o cold sx onset 4/2 Sx include: productive cough, sneezing, itchy eyes, chest/nasal congestion Denies: f/v/n/d No hx of seasonal allergies Taking Nyquil and benadryl w/little relief.  She is alert and oriented w/no signs of acute distress.

## 2013-01-13 NOTE — ED Provider Notes (Signed)
History     CSN: [id number]  Arrival date & time 01/13/13  1045   First MD Initiated Contact with Patient 01/13/13 1053      Chief Complaint  Patient presents with  . URI    (Consider location/radiation/quality/duration/timing/severity/associated sxs/prior treatment) HPI Comments: Patient presents this morning complaining of ongoing and worsening cough feeling her chest is getting congested. Occasionally she brings up sputum or phlegm and loops yellowish in character. She has also been sneezing with watery and itchy eyes runny nose and some itchiness and discomfort in the back of her throat. At this point the most of bothersome symptom is her ongoing cough. She denies any fevers or shortness of breath she is a smoker. Have been using NyQuil and Benadryl and aspirin for her current symptoms.  Patient is a 57 y.o. female presenting with URI. The history is provided by the patient.  URI Presenting symptoms: congestion, cough, rhinorrhea and sore throat   Presenting symptoms: no ear pain and no fever   Severity:  Moderate Onset quality:  Gradual Duration:  3 weeks Timing:  Constant Progression:  Worsening Chronicity:  New Relieved by:  Nothing Worsened by:  Nothing tried Associated symptoms: sneezing   Associated symptoms: no arthralgias, no headaches, no neck pain, no sinus pain, no swollen glands and no wheezing   Risk factors: no recent travel     Past Medical History  Diagnosis Date  . Hypertension   . High cholesterol     Past Surgical History  Procedure Laterality Date  . Cholecystectomy    . Bladder surgery    . Shoulder surgery    . Mandible fracture surgery      No family history on file.  History  Substance Use Topics  . Smoking status: Never Smoker   . Smokeless tobacco: Not on file  . Alcohol Use: No    OB History   Grav Para Term Preterm Abortions TAB SAB Ect Mult Living                  Review of Systems  Constitutional: Negative for fever,  diaphoresis, activity change and appetite change.  HENT: Positive for congestion, sore throat, rhinorrhea, sneezing and postnasal drip. Negative for ear pain, facial swelling, trouble swallowing, neck pain and voice change.   Respiratory: Positive for cough. Negative for apnea, choking, chest tightness, wheezing and stridor.   Musculoskeletal: Negative for arthralgias.  Skin: Negative for rash.  Neurological: Negative for headaches.    Allergies  Review of patient's allergies indicates no known allergies.  Home Medications   Current Outpatient Rx  Name  Route  Sig  Dispense  Refill  . cephALEXin (KEFLEX) 500 MG capsule   Oral   Take 1 capsule (500 mg total) by mouth 4 (four) times daily. Take all of medicine and drink lots of fluids   20 capsule   0   . cephALEXin (KEFLEX) 500 MG capsule   Oral   Take 1 capsule (500 mg total) by mouth 4 (four) times daily.   20 capsule   0   . HYDRALAZINE-HCTZ PO   Oral   Take by mouth.         . hydrochlorothiazide (HYDRODIURIL) 25 MG tablet   Oral   Take 25 mg by mouth daily.         Marland Kitchen moxifloxacin (VIGAMOX) 0.5 % ophthalmic solution   Left Eye   Place 1 drop into the left eye 3 (three) times daily. After warm cloth  soak to eyelid   3 mL   0   . moxifloxacin (VIGAMOX) 0.5 % ophthalmic solution   Left Eye   Place 1 drop into the left eye 3 (three) times daily.   3 mL   0   . NIFEdipine (PROCARDIA XL/ADALAT-CC) 90 MG 24 hr tablet   Oral   Take 90 mg by mouth daily.         Marland Kitchen OLANZapine (ZYPREXA) 20 MG tablet   Oral   Take 20 mg by mouth at bedtime.         Marland Kitchen PRAVASTATIN SODIUM PO   Oral   Take by mouth.           BP 151/67  Pulse 97  Temp(Src) 98.4 F (36.9 C) (Oral)  Resp 24  SpO2 97%  Physical Exam  Nursing note and vitals reviewed. Constitutional: She is oriented to person, place, and time. She appears well-developed. No distress.  HENT:  Head: Normocephalic.  Mouth/Throat: No oropharyngeal  exudate.  Eyes: EOM are normal. Pupils are equal, round, and reactive to light. Right eye exhibits no discharge. Left eye exhibits no discharge. Right conjunctiva is injected. Left conjunctiva is injected. No scleral icterus.  Neck: Neck supple. No JVD present.  Pulmonary/Chest: Effort normal. No respiratory distress. She has decreased breath sounds. She has no wheezes. She has rhonchi. She has rales. She exhibits no tenderness.  Abdominal: There is no tenderness.  Neurological: She is alert and oriented to person, place, and time.  Skin: No rash noted. No erythema.    ED Course  Procedures (including critical care time)  Labs Reviewed - No data to display Dg Chest 2 View  01/13/2013  *RADIOLOGY REPORT*  Clinical Data: Congestion, cough, sneezing for 2 weeks, smoking history  CHEST - 2 VIEW  Comparison: Chest x-ray of 07/29/2007  Findings: No active infiltrate or effusion is seen.  There is some peribronchial thickening which may indicate bronchitis.  The heart is within upper limits of normal.  No acute bony abnormality is seen.  Surgical clips are noted around the gastroesophageal junction.  IMPRESSION: No pneumonia.  Mild peribronchial thickening may indicate bronchitis.   Original Report Authenticated By: Dwyane Dee, M.D.      No diagnosis found.    MDM  Chronic smoker moderate bronchitis. Most likely exacerbation was induced by seasonal allergies. Will treat patient with a course of a broad-spectrum antibiotic antihistamines and will start patient on nasal steroid spray. Smoking cessation was also discussed patient seemed to be pre-contemplative.        Jimmie Molly, MD 01/13/13 1210

## 2013-11-03 ENCOUNTER — Other Ambulatory Visit (HOSPITAL_COMMUNITY): Payer: Self-pay | Admitting: Family Medicine

## 2013-11-03 DIAGNOSIS — Z1231 Encounter for screening mammogram for malignant neoplasm of breast: Secondary | ICD-10-CM

## 2013-12-08 ENCOUNTER — Other Ambulatory Visit (HOSPITAL_COMMUNITY): Payer: Self-pay | Admitting: Family Medicine

## 2013-12-08 DIAGNOSIS — Z1231 Encounter for screening mammogram for malignant neoplasm of breast: Secondary | ICD-10-CM

## 2014-01-09 ENCOUNTER — Ambulatory Visit (HOSPITAL_COMMUNITY)
Admission: RE | Admit: 2014-01-09 | Discharge: 2014-01-09 | Disposition: A | Discharge status: Home / Self Care | Payer: Medicare HMO | Source: Ambulatory Visit | Attending: Family Medicine | Admitting: Family Medicine

## 2014-01-09 DIAGNOSIS — Z1231 Encounter for screening mammogram for malignant neoplasm of breast: Secondary | ICD-10-CM

## 2014-12-25 ENCOUNTER — Other Ambulatory Visit (HOSPITAL_COMMUNITY): Payer: Self-pay | Admitting: Family Medicine

## 2014-12-25 DIAGNOSIS — Z1231 Encounter for screening mammogram for malignant neoplasm of breast: Secondary | ICD-10-CM

## 2015-01-15 ENCOUNTER — Ambulatory Visit (HOSPITAL_COMMUNITY)
Admission: RE | Admit: 2015-01-15 | Discharge: 2015-01-15 | Disposition: A | Discharge status: Home / Self Care | Payer: Medicare HMO | Source: Ambulatory Visit | Attending: Family Medicine | Admitting: Family Medicine

## 2015-01-15 DIAGNOSIS — Z1231 Encounter for screening mammogram for malignant neoplasm of breast: Secondary | ICD-10-CM | POA: Insufficient documentation

## 2015-12-20 ENCOUNTER — Other Ambulatory Visit: Payer: Self-pay

## 2015-12-20 DIAGNOSIS — Z1231 Encounter for screening mammogram for malignant neoplasm of breast: Secondary | ICD-10-CM

## 2016-01-17 ENCOUNTER — Ambulatory Visit: Payer: Medicare HMO

## 2016-01-27 ENCOUNTER — Ambulatory Visit
Admission: RE | Admit: 2016-01-27 | Discharge: 2016-01-27 | Disposition: A | Discharge status: Home / Self Care | Payer: Medicare HMO | Source: Ambulatory Visit

## 2016-01-27 DIAGNOSIS — Z1231 Encounter for screening mammogram for malignant neoplasm of breast: Secondary | ICD-10-CM

## 2016-11-16 ENCOUNTER — Ambulatory Visit (INDEPENDENT_AMBULATORY_CARE_PROVIDER_SITE_OTHER)
Admission: RE | Admit: 2016-11-16 | Discharge: 2016-11-16 | Disposition: A | Discharge status: Home / Self Care | Payer: Medicare HMO | Source: Ambulatory Visit | Attending: Internal Medicine | Admitting: Internal Medicine

## 2016-11-16 ENCOUNTER — Encounter: Payer: Self-pay | Admitting: Internal Medicine

## 2016-11-16 ENCOUNTER — Ambulatory Visit (INDEPENDENT_AMBULATORY_CARE_PROVIDER_SITE_OTHER): Payer: Medicare HMO | Admitting: Internal Medicine

## 2016-11-16 VITALS — BP 170/90 | HR 113 | Ht 63.0 in | Wt 212.0 lb

## 2016-11-16 DIAGNOSIS — R05 Cough: Secondary | ICD-10-CM | POA: Diagnosis not present

## 2016-11-16 DIAGNOSIS — R059 Cough, unspecified: Secondary | ICD-10-CM

## 2016-11-16 DIAGNOSIS — I10 Essential (primary) hypertension: Secondary | ICD-10-CM | POA: Diagnosis not present

## 2016-11-16 DIAGNOSIS — F17210 Nicotine dependence, cigarettes, uncomplicated: Secondary | ICD-10-CM | POA: Diagnosis not present

## 2016-11-16 NOTE — Patient Instructions (Addendum)
Please remember to go to the  x-ray department downstairs in the basement  for your tests - we will call you with the results when they are available.  GERD (REFLUX)  is an extremely common cause of respiratory symptoms just like yours , many times with no obvious heartburn at all.    It can be treated with medication, but also with lifestyle changes including elevation of the head of your bed (ideally with 6 inch  bed blocks),  Smoking cessation, avoidance of late meals, excessive alcohol, and avoid fatty foods, chocolate, peppermint, colas, red wine, and acidic juices such as orange juice.  NO MINT OR MENTHOL PRODUCTS SO NO COUGH DROPS  USE SUGARLESS CANDY INSTEAD (Jolley ranchers or Stover's or Life Savers) or even ice chips will also do - the key is to swallow to prevent all throat clearing. NO OIL BASED VITAMINS - use powdered substitutes.  Wear scarf when out in cold weather to keep your airways warmer     See your psychiatrist as soon as possible for help with stopping smoking - it is not too late!!!

## 2016-11-16 NOTE — Progress Notes (Signed)
Subjective:     Patient ID: Sharon [name], female   DOB: [birth year],    MRN: [id number]  HPI  [age] yobf active smoker with schizophrenia followed by Sharon [name] on zyprexa / [place] self-referred to pulmonary clinic for new [name] x 2015   11/16/2016 1st [name] office visit/ Sharon [name]   Chief Complaint  Patient presents with  . Pulmonary Consult    Self referral. Pt c/o [name] the past 2 yrs. She is currently recovering from the flu and was coughing more recently. Her [name] is occ prod with yellow sputum in the am.    [name] wax and wane x 2 years worse when go out in cold weather or come back in to warm house and same problem in summer    No assoc sob or   mucus plugs  Or hemoptysis  No obvious day to day or daytime variability or assoc sob  or cp or chest tightness, subjective wheeze or overt sinus or hb symptoms. No unusual exp hx or h/o childhood pna/ asthma or knowledge of premature birth.  Sleeping ok without nocturnal  or early am exacerbation  of respiratory  c/o's or need for noct saba. Also denies any obvious fluctuation of symptoms with weather or environmental changes or other aggravating or alleviating factors except as outlined above   Current Medications, Allergies, Complete Past Medical History, Past Surgical History, Family History, and Social History were reviewed in [facility] electronic medical record.  ROS  The following are not active complaints unless bolded sore throat, dysphagia, dental problems, itching, sneezing,  nasal congestion or excess/ purulent secretions, ear ache,   fever, chills, sweats, unintended wt loss, classically pleuritic or exertional cp,  orthopnea pnd or leg swelling, presyncope, palpitations, abdominal pain, anorexia, nausea, vomiting, diarrhea  or change in bowel or bladder habits, change in stools or urine, dysuria,hematuria,  rash, arthralgias, visual complaints, headache, numbness, weakness or ataxia or problems with walking or  coordination,  change in mood/affect or memory.           Review of Systems     Objective:   Physical Exam    amb animated obese bf nad talking at twice nl volume   Wt Readings from Last 3 Encounters:  11/16/16 212 lb (96.2 kg)    Vital signs reviewed - Note on arrival 02 sats  100% on RA - note bp 170/90      HEENT: nl dentition, turbinates bilaterally, and oropharynx. Nl external ear canals without [name] reflex   NECK :  without JVD/Nodes/TM/ nl carotid upstrokes bilaterally   LUNGS: no acc muscle use,  Nl contour chest which is clear to A and P bilaterally without [name] on insp or exp maneuvers   CV:  RRR  no s3 or murmur or increase in P2, and no edema   ABD:  soft and nontender with nl inspiratory excursion in the supine position. No bruits or organomegaly appreciated, bowel sounds nl  MS:  Nl gait/ ext warm without deformities, calf tenderness, cyanosis or clubbing No obvious joint restrictions   SKIN: warm and dry without lesions    NEURO:  alert, approp, nl sensorium with  no motor or cerebellar deficits apparent.     CXR PA and Lateral:   11/16/2016 :    I personally reviewed images and agree with radiology impression as follows:    No active cardiopulmonary disease. Assessment:

## 2016-11-17 ENCOUNTER — Institutional Professional Consult (permissible substitution): Payer: Medicare HMO | Admitting: Internal Medicine

## 2016-11-17 DIAGNOSIS — F17210 Nicotine dependence, cigarettes, uncomplicated: Secondary | ICD-10-CM | POA: Insufficient documentation

## 2016-11-17 NOTE — Assessment & Plan Note (Signed)

## 2016-11-17 NOTE — Progress Notes (Signed)
Spoke with pt and notified of results per Dr. Wert. Pt verbalized understanding and denied any questions. 

## 2016-11-17 NOTE — Assessment & Plan Note (Addendum)
Not optimally controlled on present regimen. I reviewed this with the patient and emphasized importance of follow-up with primary care/ avoid na   ? Might benefit from BB or clonidine if will take it > defer to PCP

## 2016-11-17 NOTE — Assessment & Plan Note (Signed)
Spirometry 11/16/2016  FEV1 1.44 (70%)  Ratio 81 s curvature    She clearly has CB related to smoking but no active asthma and certainly no copd so main focus should be on smoking cessation at this point - (see separate a/p)   Best rx is mucinex dm, uses scarves on exp to cold air to prevent airways from cooling too much as this can certainly aggravate airways inflammation but not role for inhalers here  Total time devoted to counseling  > 50 % of initial 60 min office visit:  review case with pt/daughter discussion of options/alternatives/ personally creating written customized instructions  in presence of pt  then going over those specific  Instructions directly with the pt including how to use all of the meds but in particular covering each new medication in detail and the difference between the maintenance= "automatic" meds and the prns using an action plan format for the latter (If this problem/symptom => do that organization reading Left to right).  Please see AVS from this visit for a full list of these instructions which I personally wrote for this pt and  are unique to this visit.

## 2016-12-05 ENCOUNTER — Institutional Professional Consult (permissible substitution): Payer: Medicare HMO | Admitting: Internal Medicine

## 2017-02-17 ENCOUNTER — Emergency Department (HOSPITAL_COMMUNITY)
Admission: EM | Admit: 2017-02-17 | Discharge: 2017-02-19 | Disposition: A | Discharge status: Other Acute Inpatient Hospital | Payer: Medicare HMO | Attending: Emergency Medicine | Admitting: Emergency Medicine

## 2017-02-17 ENCOUNTER — Emergency Department (HOSPITAL_COMMUNITY): Payer: Medicare HMO

## 2017-02-17 DIAGNOSIS — Z79899 Other long term (current) drug therapy: Secondary | ICD-10-CM | POA: Diagnosis not present

## 2017-02-17 DIAGNOSIS — I10 Essential (primary) hypertension: Secondary | ICD-10-CM | POA: Diagnosis not present

## 2017-02-17 DIAGNOSIS — Z046 Encounter for general psychiatric examination, requested by authority: Secondary | ICD-10-CM | POA: Diagnosis present

## 2017-02-17 DIAGNOSIS — F200 Paranoid schizophrenia: Secondary | ICD-10-CM | POA: Diagnosis not present

## 2017-02-17 DIAGNOSIS — F17210 Nicotine dependence, cigarettes, uncomplicated: Secondary | ICD-10-CM | POA: Diagnosis not present

## 2017-02-17 DIAGNOSIS — Z0000 Encounter for general adult medical examination without abnormal findings: Secondary | ICD-10-CM

## 2017-02-17 DIAGNOSIS — F17200 Nicotine dependence, unspecified, uncomplicated: Secondary | ICD-10-CM | POA: Insufficient documentation

## 2017-02-17 DIAGNOSIS — F203 Undifferentiated schizophrenia: Secondary | ICD-10-CM | POA: Insufficient documentation

## 2017-02-17 LAB — URINALYSIS, ROUTINE W REFLEX MICROSCOPIC
BILIRUBIN URINE: NEGATIVE
Bacteria, UA: NONE SEEN
Glucose, UA: NEGATIVE mg/dL
HGB URINE DIPSTICK: NEGATIVE
KETONES UR: NEGATIVE mg/dL
LEUKOCYTES UA: NEGATIVE
NITRITE: NEGATIVE
Protein, ur: 30 mg/dL — AB
Specific Gravity, Urine: 1.011 (ref 1.005–1.030)
pH: 5 (ref 5.0–8.0)

## 2017-02-17 LAB — COMPREHENSIVE METABOLIC PANEL
ALBUMIN: 4.3 g/dL (ref 3.5–5.0)
ALT: 20 U/L (ref 14–54)
ANION GAP: 13 (ref 5–15)
AST: 18 U/L (ref 15–41)
Alkaline Phosphatase: 89 U/L (ref 38–126)
BUN: 18 mg/dL (ref 6–20)
CHLORIDE: 97 mmol/L — AB (ref 101–111)
CO2: 26 mmol/L (ref 22–32)
Calcium: 9.7 mg/dL (ref 8.9–10.3)
Creatinine, Ser: 0.75 mg/dL (ref 0.44–1.00)
GFR calc Af Amer: >60 mL/min (ref >60)
GFR calc non Af Amer: >60 mL/min (ref >60)
GLUCOSE: 125 mg/dL — AB (ref 65–99)
POTASSIUM: 2.8 mmol/L — AB (ref 3.5–5.1)
SODIUM: 136 mmol/L (ref 135–145)
Total Bilirubin: 0.8 mg/dL (ref 0.3–1.2)
Total Protein: 7.8 g/dL (ref 6.5–8.1)

## 2017-02-17 LAB — RAPID URINE DRUG SCREEN, HOSP PERFORMED
Amphetamines: NOT DETECTED
BENZODIAZEPINES: NOT DETECTED
Barbiturates: NOT DETECTED
COCAINE: NOT DETECTED
OPIATES: NOT DETECTED
TETRAHYDROCANNABINOL: NOT DETECTED

## 2017-02-17 LAB — ETHANOL: Alcohol, Ethyl (B): <5 mg/dL (ref <5)

## 2017-02-17 MED ORDER — LORATADINE 10 MG PO TABS
10.0000 mg | ORAL_TABLET | Freq: Every day | ORAL | Status: DC | PRN
  Administered 2017-02-18: 10 mg via ORAL
  Filled 2017-02-17: qty 1

## 2017-02-17 MED ORDER — HYDROCHLOROTHIAZIDE 25 MG PO TABS
25.0000 mg | ORAL_TABLET | Freq: Every day | ORAL | Status: DC
  Administered 2017-02-17 – 2017-02-18 (×2): 25 mg via ORAL
  Filled 2017-02-17 (×3): qty 1

## 2017-02-17 MED ORDER — POTASSIUM CHLORIDE CRYS ER 20 MEQ PO TBCR
40.0000 meq | EXTENDED_RELEASE_TABLET | Freq: Once | ORAL | Status: AC
  Administered 2017-02-17: 40 meq via ORAL
  Filled 2017-02-17: qty 2

## 2017-02-17 MED ORDER — HYDROXYZINE HCL 25 MG PO TABS: 50.0000 mg | ORAL_TABLET | Freq: Every evening | ORAL | Status: DC | PRN

## 2017-02-17 MED ORDER — PRAVASTATIN SODIUM 40 MG PO TABS
40.0000 mg | ORAL_TABLET | Freq: Every day | ORAL | Status: DC
  Administered 2017-02-17: 40 mg via ORAL
  Filled 2017-02-17 (×2): qty 1

## 2017-02-17 MED ORDER — OLANZAPINE 10 MG PO TABS
10.0000 mg | ORAL_TABLET | Freq: Two times a day (BID) | ORAL | Status: DC
  Administered 2017-02-17 – 2017-02-18 (×4): 10 mg via ORAL
  Filled 2017-02-17 (×5): qty 1

## 2017-02-17 MED ORDER — TRAZODONE HCL 100 MG PO TABS
100.0000 mg | ORAL_TABLET | Freq: Every day | ORAL | Status: DC
  Administered 2017-02-18: 100 mg via ORAL
  Filled 2017-02-17 (×2): qty 1

## 2017-02-17 MED ORDER — OLANZAPINE 10 MG PO TABS: 10.0000 mg | ORAL_TABLET | Freq: Every day | ORAL | Status: DC

## 2017-02-17 MED ORDER — NIFEDIPINE ER OSMOTIC RELEASE 30 MG PO TB24
90.0000 mg | ORAL_TABLET | Freq: Every day | ORAL | Status: DC
  Administered 2017-02-17 – 2017-02-18 (×2): 90 mg via ORAL
  Filled 2017-02-17 (×6): qty 1

## 2017-02-17 NOTE — Progress Notes (Signed)
Patient accepted to [facility] by Dr. Wendy PoetJack Wang on 02/18/2017 after 10am. Patient's RN can call report to [phone].    Sharon [name], LCSWA [name]  Clinical Social Worker [phone]

## 2017-02-17 NOTE — ED Notes (Signed)
EKG complete.  Reviewed with EDP.

## 2017-02-17 NOTE — ED Triage Notes (Signed)
Pt brought in by [employer] d/t children came up to find mother walking @ 0100-0200 in the morning, talking to self.  Hx of schizophrenia.

## 2017-02-17 NOTE — ED Provider Notes (Signed)
By signing my name below, I, Diona Browner, attest that this documentation has been prepared under the direction and in the presence of Ward, Layla Maw, DO. Electronically Signed: Diona Browner, ED Scribe. 02/17/17. 2:36 AM.  TIME SEEN: 2:36 AM  CHIEF COMPLAINT: IVC  HPI:  Sharon [name] is a 61 y.o. female with a PMHx of schizophrenia who presents to the Emergency Department because of IVC papers that were completed by her daughter. Pt's daughter notes she has been talking to herself and others who are not there. She is wandering around outside during the middle of the night. Pt has not been eating or drinking normally. She hasn't been taking her medications. Not attending to her personal hygiene. No hallucinations. No noted pain anywhere. No drug or ETOH use today. Pt denies SI and HI.  ROS:  LEVEL V CAVEAT: HPI and ROS limited due to altered mental status.  PAST MEDICAL HISTORY/PAST SURGICAL HISTORY:  Past Medical History:  Diagnosis Date  . High cholesterol   . Hypertension     MEDICATIONS:  Prior to Admission medications   Medication Sig Start Date End Date Taking? Authorizing Provider  Biotin 81191 MCG TBDP Take 1 capsule by mouth daily.    [provider]  FIBER COMPLETE TABS Take 1 tablet by mouth daily.    [provider]  hydrochlorothiazide (HYDRODIURIL) 25 MG tablet Take 25 mg by mouth daily.    [provider]  loratadine (ALLERGY) 10 MG tablet Take 10 mg by mouth daily.    [provider]  NIFEdipine (PROCARDIA XL/ADALAT-CC) 90 MG 24 hr tablet Take 90 mg by mouth daily.    [provider]  OLANZapine (ZYPREXA) 20 MG tablet Take 20 mg by mouth at bedtime.    [provider]  PRAVASTATIN SODIUM PO Take by mouth.    [provider]    ALLERGIES:  No Known Allergies  SOCIAL HISTORY:  Social History  Substance Use Topics  . Smoking status: Current Every Day Smoker    Packs/day: 1.00    Years:  20.00  . Smokeless tobacco: Former Neurosurgeon  . Alcohol use No    FAMILY HISTORY: No family history on file.  EXAM: BP (!) 162/77 (BP Location: Left Arm)   Pulse 94   Temp 98.6 F (37 C) (Oral)   Resp (!) 22   Ht 5\' 3"  (1.6 m)   Wt 180 lb (81.6 kg)   LMP 01/06/2011   SpO2 97%   BMI 31.89 kg/m  CONSTITUTIONAL: Alert and oriented and responds appropriately to questions intermittently. She has been pacing about the room. Washing her hands repeatedly. HEAD: Normocephalic EYES: Conjunctivae clear, pupils appear equal, EOMI ENT: normal nose; moist mucous membranes NECK: Supple, no meningismus, no nuchal rigidity, no LAD  CARD: RRR; S1 and S2 appreciated; no murmurs, no clicks, no rubs, no gallops RESP: Normal chest excursion without splinting or tachypnea; breath sounds clear and equal bilaterally; no wheezes, no rhonchi, no rales, no hypoxia or respiratory distress, speaking full sentences ABD/GI: Normal bowel sounds; non-distended; soft, non-tender, no rebound, no guarding, no peritoneal signs, no hepatosplenomegaly BACK:  The back appears normal and is non-tender to palpation, there is no CVA tenderness EXT: Normal ROM in all joints; non-tender to palpation; no edema; normal capillary refill; no cyanosis, no calf tenderness or swelling    SKIN: Normal color for age and race; warm; no rash NEURO: Moves all extremities equally, normal gait, normal speech PSYCH: Patient appears to be paranoid,  pacing around the room. Denies to me any hallucinations. Denies SI or HI. Does not seem to have good insight in her disease process.  MEDICAL DECISION MAKING: Patient here under involuntary commitment by her daughter. At this time agrees to stay voluntarily. Appears to be paranoid, pacing around the room. Daughter concerned about abnormal behavior. We'll obtain screening labs, urine. She denies any medical complaints at this time.  ED PROGRESS: Labs show mild leukocytosis but she denies infectious  symptoms including cough, vomiting, diarrhea, pain. Nursing staff notes that patient has been urinating frequently. We'll obtain urinalysis. Blood glucose is normal at 125. Potassium is 2.8. We'll give oral placement. EKG shows no interval changes. QTC is 471 ms.  Home medications have been reordered. Patient awaiting TTS evaluation for further disposition.   4:30 AM  TTS has recommended inpatient treatment for this patient.   Date: 02/17/2017 4:30 AM  Rate: 78  Rhythm: normal sinus rhythm  QRS Axis: normal  Intervals: normal  ST/T Wave abnormalities: normal  Conduction Disutrbances: none  Narrative Interpretation: unremarkable    I personally performed the services described in this documentation, which was scribed in my presence. The recorded information has been reviewed and is accurate.     Ward, Layla MawKristen N, DO 02/17/17 [phone]

## 2017-02-17 NOTE — ED Notes (Signed)
Hourly rounding reveals patient sleeping in room. No complaints, stable, in no acute distress. Q15 minute rounds and monitoring via Security Cameras to continue. 

## 2017-02-17 NOTE — ED Notes (Signed)
Called lab and radiology.  Will be by soon to do BMP and chest x-ray.

## 2017-02-17 NOTE — ED Notes (Addendum)
Pt has been picking on cuticles since arrival.  Left thumb bleeding @ cuticle site during lab draw by this Clinical research associatewriter.  Pt has been in to void 10+ times.  Dr. Elesa MassedWard informed.

## 2017-02-17 NOTE — ED Notes (Signed)
Patient continues to respond to internal stimuli.  Non-aggressive. Support and 15' checks cont for safety.

## 2017-02-17 NOTE — ED Notes (Signed)
Report to include Situation, Background, Assessment, and Recommendations received from [facility] RN. Patient alert, warm and dry, in no acute distress. Patient denies SI, HI, AVH and pain. Patient made aware of Q15 minute rounds and security cameras for their safety. Patient instructed to come to me with needs or concerns.

## 2017-02-17 NOTE — ED Notes (Signed)
Pt dressed in paper scrubs, yellow socks, wanded by Security.

## 2017-02-17 NOTE — ED Notes (Signed)
TTS assessment in process  

## 2017-02-17 NOTE — ED Notes (Signed)
Please keep Homero Fellersarsha Backs updated on [phone] on patients progress and evaluation on behalf of family. She has offered to come in the morning for pt psychiatric eval.
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
## 2017-02-17 NOTE — ED Notes (Signed)
Pt. Transferred to [facility] from ED to room 36 after screening for contraband. Report to include Situation, Background, Assessment and Recommendations from Elaine RN. Pt. Oriented to unit including Q15 minute rounds as well as the security cameras for their protection. Patient is alert and oriented, warm and dry in no acute distress. Patient denies SI, HI, and AVH. Pt. Encouraged to let me know if needs arise. 

## 2017-02-17 NOTE — ED Notes (Signed)
Hourly rounding reveals patient in room responding to internal stimuli. No complaints, stable, in no acute distress. Q15 minute rounds and monitoring via [employer] Cameras to continue.

## 2017-02-17 NOTE — Progress Notes (Signed)
CSW faxed patient's referral to: [facility], [facility], [facility], [facility], [facility], [facility].   Celso SickleKimberly Tanor Glaspy, LCSWA [facility]  Clinical Social Worker [phone]

## 2017-02-17 NOTE — ED Notes (Signed)
Bed: WA31 Expected date:  Expected time:  Means of arrival:  Comments: [employer]-IVC 

## 2017-02-17 NOTE — ED Notes (Signed)
Pt stated "[facility]  I saw them about 4 wks ago.  I didn't take my zyprexa last night."

## 2017-02-17 NOTE — ED Notes (Signed)
Patient calm and cooperative.  Patient was irritable earlier when MD made rounds.  When asked about her daughter, she states, "I don't have a daughter."

## 2017-02-17 NOTE — ED Notes (Signed)
Hourly rounding reveals patient in room. No complaints, stable, in no acute distress. Q15 minute rounds and monitoring via Security Cameras to continue. 

## 2017-02-17 NOTE — Progress Notes (Signed)
CSW filed patient's examination and recommendation paperwork into IVC logbook.  Ryla Cauthon, LCSWA [facility]  Clinical Social Worker [phone] 

## 2017-02-17 NOTE — ED Notes (Signed)
Patient observed in her room talking to herself.  She denies any thoughts of self harm.  Patient's BP was elevated prior to coming to [facility] and her BP medication was given.  She appears to be responding to internal stimuli.  Patient has not been taking her medications per family.  Patient is cooperative and her behavior has been appropriate for situation

## 2017-02-18 DIAGNOSIS — F200 Paranoid schizophrenia: Secondary | ICD-10-CM

## 2017-02-18 DIAGNOSIS — F17210 Nicotine dependence, cigarettes, uncomplicated: Secondary | ICD-10-CM | POA: Diagnosis not present

## 2017-02-18 LAB — BASIC METABOLIC PANEL
ANION GAP: 11 (ref 5–15)
BUN: 15 mg/dL (ref 6–20)
CHLORIDE: 100 mmol/L — AB (ref 101–111)
CO2: 29 mmol/L (ref 22–32)
Calcium: 9.7 mg/dL (ref 8.9–10.3)
Creatinine, Ser: 0.68 mg/dL (ref 0.44–1.00)
GFR calc Af Amer: >60 mL/min (ref >60)
GFR calc non Af Amer: >60 mL/min (ref >60)
GLUCOSE: 109 mg/dL — AB (ref 65–99)
POTASSIUM: 3.2 mmol/L — AB (ref 3.5–5.1)
Sodium: 140 mmol/L (ref 135–145)

## 2017-02-18 NOTE — ED Notes (Signed)
Hourly rounding reveals patient in room. No complaints, stable, in no acute distress. Q15 minute rounds and monitoring via Security Cameras to continue. 

## 2017-02-18 NOTE — ED Notes (Signed)
Report to include situation, background, assessment and recommendations from LuAnn RN. Patient sleeping, respirations regular and unlabored. Q15 minute rounds and security camera observation to continue.  . 

## 2017-02-18 NOTE — ED Notes (Signed)
Hourly rounding reveals patient sleeping in room. No complaints, stable, in no acute distress. Q15 minute rounds and monitoring via Security Cameras to continue. 
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
## 2017-02-18 NOTE — ED Notes (Signed)
[facility] informing that admission is pushed to 5/21. She reports they are "at capacity".

## 2017-02-18 NOTE — [facility] Counselor (Signed)
02/18/2017, 0932  [facility], [phone], and [facility]. Micaela reports Patient will not be accepted today, 02/18/2017, due to low bed count, however they will accept her tomorrow, 02/19/2017 after 0900.  Micaela reports the accepting Doctor Wendy PoetJack Wang continues to be the accepting doctor.

## 2017-02-18 NOTE — ED Notes (Signed)
BP deferred.  Patient resting quietly. Reported to oncoming RN.

## 2017-02-18 NOTE — Progress Notes (Signed)
Britta [facility]Barbara from [facility] placed [facility] on their waitlist for tomorrow discharges.  Patient's nurse, [facility] RN Luann advised that [facility].  Education officer, environmentalWriter left voicemail for [facility]bara at [facility] informing that patient has been placed already and does not need bed at this time.  [place] Abtsatia Xzaria Teo, LCSWA Disposition staff 02/18/2017 1:54 PM

## 2017-02-18 NOTE — ED Notes (Signed)
Pt. In shower. 

## 2017-02-19 LAB — CBC WITH DIFFERENTIAL/PLATELET
BASOS PCT: 0 %
Basophils Absolute: 0 10*3/uL (ref 0.0–0.1)
Eosinophils Absolute: 0.1 10*3/uL (ref 0.0–0.7)
Eosinophils Relative: 1 %
HEMATOCRIT: 43.4 % (ref 36.0–46.0)
HEMOGLOBIN: 14.8 g/dL (ref 12.0–15.0)
LYMPHS PCT: 47 %
Lymphs Abs: 6.7 10*3/uL — ABNORMAL HIGH (ref 0.7–4.0)
MCH: 26.2 pg (ref 26.0–34.0)
MCHC: 34.1 g/dL (ref 30.0–36.0)
MCV: 77 fL — ABNORMAL LOW (ref 78.0–100.0)
Monocytes Absolute: 0.9 10*3/uL (ref 0.1–1.0)
Monocytes Relative: 6 %
NEUTROS PCT: 46 %
Neutro Abs: 6.5 10*3/uL (ref 1.7–7.7)
Other: 0 %
Platelets: 372 10*3/uL (ref 150–400)
RBC: 5.64 MIL/uL — ABNORMAL HIGH (ref 3.87–5.11)
RDW: 15.7 % — AB (ref 11.5–15.5)
WBC: 14.2 10*3/uL — AB (ref 4.0–10.5)

## 2017-02-19 LAB — PATHOLOGIST SMEAR REVIEW

## 2017-02-19 NOTE — ED Notes (Signed)
Hourly rounding reveals patient sleeping in room. No complaints, stable, in no acute distress. Q15 minute rounds and monitoring via Security Cameras to continue. 
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
## 2017-11-07 IMAGING — DX DG CHEST 2V
2 series · 2 of 2 positions shown · non-contrast
Comparison: 01/13/2013

CLINICAL DATA: Cough

EXAM:
CHEST  2 VIEW

[chest pa]
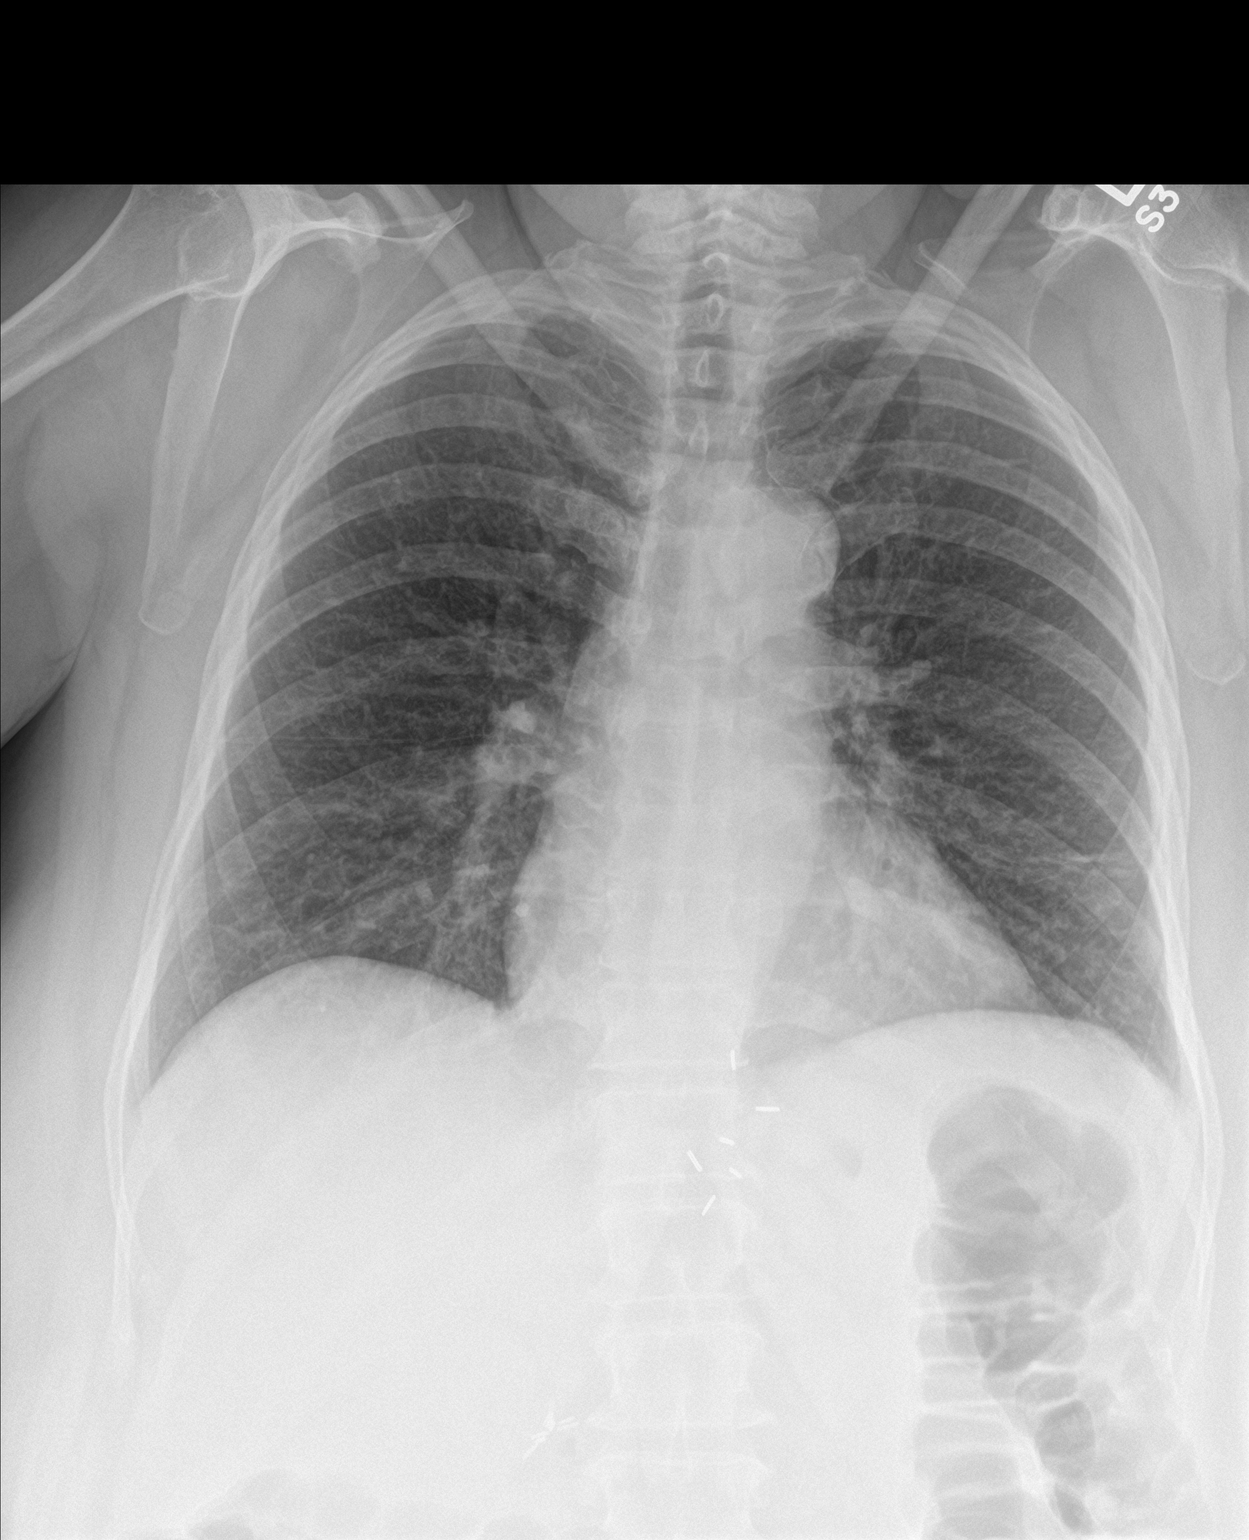

[chest lat]
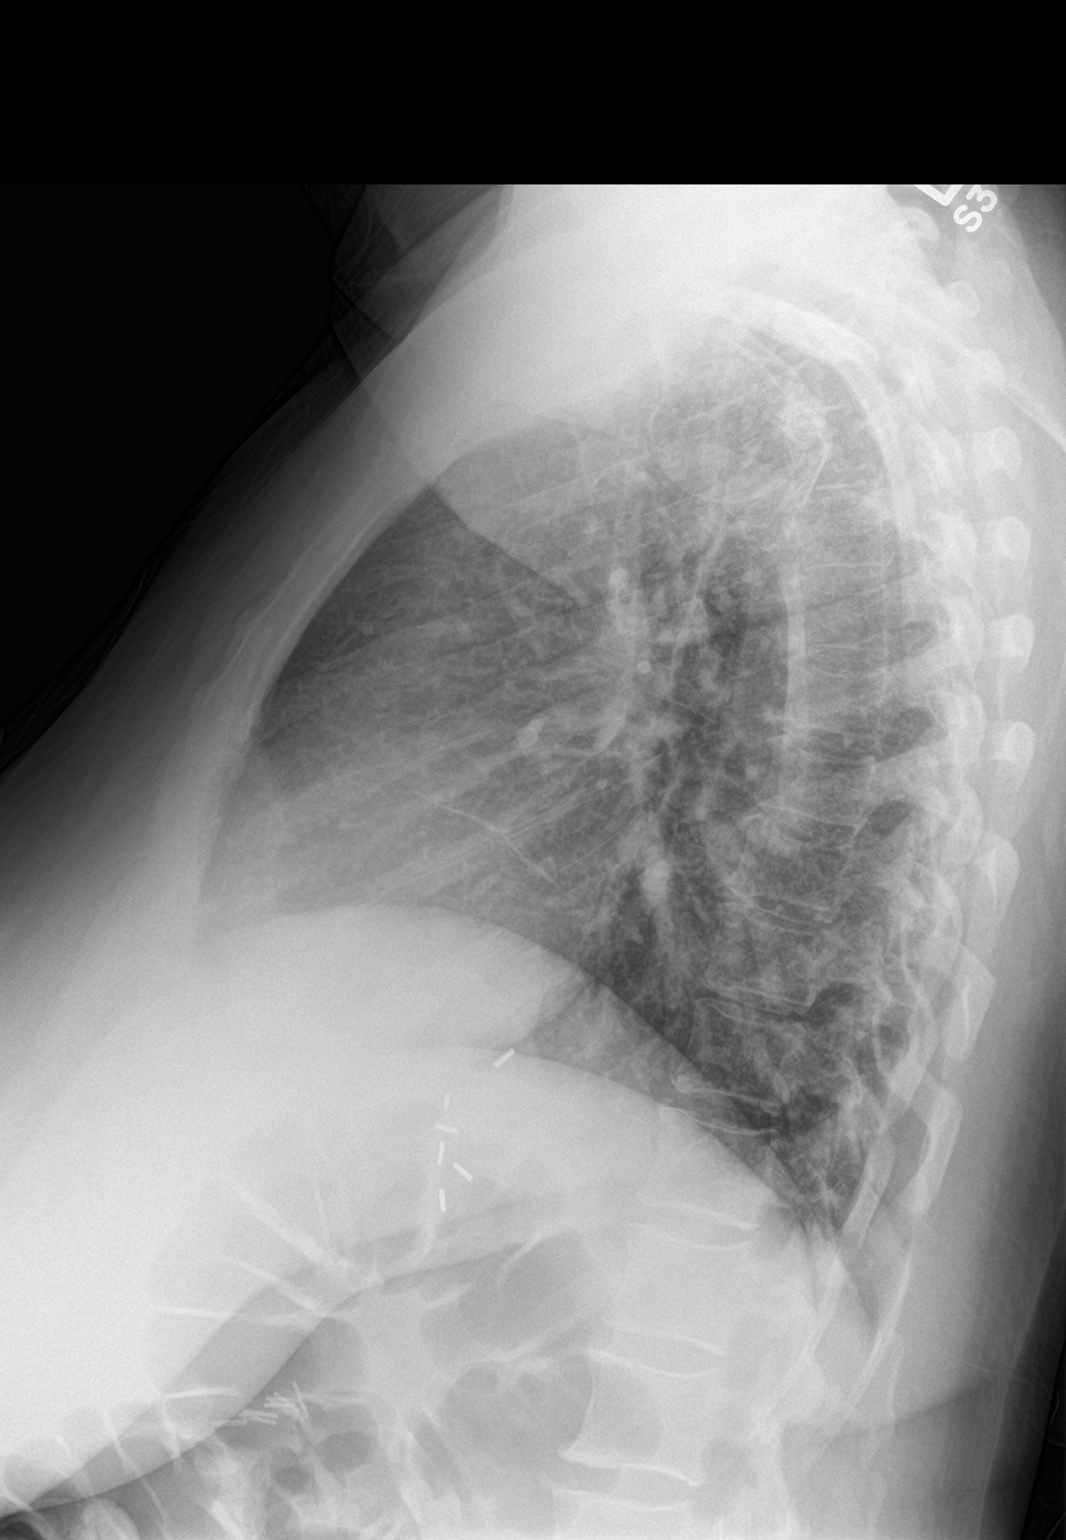

[2 of 2 positions shown; findings below may reference images not displayed]

FINDINGS: The heart size and mediastinal contours are within normal limits.
Both lungs are clear. The visualized skeletal structures are
unremarkable. Mild lingular scarring is unchanged.
IMPRESSION: No active cardiopulmonary disease.

## 2021-12-31 DEATH — deceased
